# Patient Record
Sex: Female | Born: 1960 | Hispanic: No | Marital: Married | Smoking: Never smoker
Health system: Southern US, Community
[De-identification: ages and names within clinical notes are randomized; demographics above are authoritative.]

---

## 2017-09-21 ENCOUNTER — Emergency Department (HOSPITAL_BASED_OUTPATIENT_CLINIC_OR_DEPARTMENT_OTHER): Payer: Managed Care, Other (non HMO)

## 2017-09-21 ENCOUNTER — Emergency Department (HOSPITAL_BASED_OUTPATIENT_CLINIC_OR_DEPARTMENT_OTHER)
Admission: EM | Admit: 2017-09-21 | Discharge: 2017-09-21 | Disposition: A | Discharge status: Home / Self Care | Payer: Managed Care, Other (non HMO) | Attending: Emergency Medicine | Admitting: Emergency Medicine

## 2017-09-21 ENCOUNTER — Other Ambulatory Visit: Payer: Self-pay

## 2017-09-21 ENCOUNTER — Encounter (HOSPITAL_BASED_OUTPATIENT_CLINIC_OR_DEPARTMENT_OTHER): Payer: Self-pay | Admitting: *Deleted

## 2017-09-21 DIAGNOSIS — R109 Unspecified abdominal pain: Secondary | ICD-10-CM | POA: Insufficient documentation

## 2017-09-21 DIAGNOSIS — N281 Cyst of kidney, acquired: Secondary | ICD-10-CM | POA: Diagnosis not present

## 2017-09-21 LAB — COMPREHENSIVE METABOLIC PANEL
ALK PHOS: 67 U/L (ref 38–126)
ALT: 19 U/L (ref 0–44)
ANION GAP: 8 (ref 5–15)
AST: 21 U/L (ref 15–41)
Albumin: 4.2 g/dL (ref 3.5–5.0)
BILIRUBIN TOTAL: 0.7 mg/dL (ref 0.3–1.2)
BUN: 18 mg/dL (ref 6–20)
CALCIUM: 9.2 mg/dL (ref 8.9–10.3)
CO2: 30 mmol/L (ref 22–32)
Chloride: 102 mmol/L (ref 98–111)
Creatinine, Ser: 0.77 mg/dL (ref 0.44–1.00)
GFR calc Af Amer: >60 mL/min (ref >60)
GFR calc non Af Amer: >60 mL/min (ref >60)
Glucose, Bld: 116 mg/dL — ABNORMAL HIGH (ref 70–99)
Potassium: 4 mmol/L (ref 3.5–5.1)
SODIUM: 140 mmol/L (ref 135–145)
Total Protein: 6.8 g/dL (ref 6.5–8.1)

## 2017-09-21 LAB — URINALYSIS, ROUTINE W REFLEX MICROSCOPIC
Bilirubin Urine: NEGATIVE
Glucose, UA: NEGATIVE mg/dL
Ketones, ur: NEGATIVE mg/dL
Nitrite: NEGATIVE
Protein, ur: NEGATIVE mg/dL
pH: 7 (ref 5.0–8.0)

## 2017-09-21 LAB — URINALYSIS, MICROSCOPIC (REFLEX)

## 2017-09-21 LAB — CBC
HCT: 41.7 % (ref 36.0–46.0)
HEMOGLOBIN: 14.1 g/dL (ref 12.0–15.0)
MCH: 30.8 pg (ref 26.0–34.0)
MCHC: 33.8 g/dL (ref 30.0–36.0)
MCV: 91 fL (ref 78.0–100.0)
Platelets: 237 10*3/uL (ref 150–400)
RBC: 4.58 MIL/uL (ref 3.87–5.11)
RDW: 12.1 % (ref 11.5–15.5)
WBC: 4.5 10*3/uL (ref 4.0–10.5)

## 2017-09-21 LAB — PREGNANCY, URINE: PREG TEST UR: NEGATIVE

## 2017-09-21 LAB — LIPASE, BLOOD: Lipase: 45 U/L (ref 11–51)

## 2017-09-21 MED ORDER — MORPHINE SULFATE (PF) 4 MG/ML IV SOLN
4.0000 mg | Freq: Once | INTRAVENOUS | Status: AC
  Administered 2017-09-21: 4 mg via INTRAVENOUS
  Filled 2017-09-21: qty 1

## 2017-09-21 MED ORDER — NAPROXEN 500 MG PO TABS: 500.0000 mg | ORAL_TABLET | Freq: Two times a day (BID) | ORAL | 0 refills | Status: DC

## 2017-09-21 MED ORDER — ONDANSETRON HCL 4 MG PO TABS: 4.0000 mg | ORAL_TABLET | Freq: Three times a day (TID) | ORAL | 0 refills | Status: DC | PRN

## 2017-09-21 MED ORDER — CEPHALEXIN 500 MG PO CAPS: 500.0000 mg | ORAL_CAPSULE | Freq: Four times a day (QID) | ORAL | 0 refills | Status: DC

## 2017-09-21 MED ORDER — ONDANSETRON HCL 4 MG/2ML IJ SOLN
4.0000 mg | Freq: Once | INTRAMUSCULAR | Status: AC
  Administered 2017-09-21: 4 mg via INTRAVENOUS
  Filled 2017-09-21: qty 2

## 2017-09-21 MED ORDER — SODIUM CHLORIDE 0.9 % IV BOLUS
1000.0000 mL | Freq: Once | INTRAVENOUS | Status: AC
  Administered 2017-09-21: 1000 mL via INTRAVENOUS

## 2017-09-21 NOTE — ED Provider Notes (Signed)
[facility] EMERGENCY DEPARTMENT Provider Note   CSN: [id number] Arrival date & time: 09/21/17  5102     History   Chief Complaint Chief Complaint  Patient presents with  . Abdominal Pain    HPI Briana Reid is a 57 y.o. female with a history of kidney stones presents emergency department today for left flank pain and left abdominal pain.  Patient reports that yesterday around 5 PM she started developing a achy pain in her left thoracic/lower back.  She reports that this started radiating to her abdomen around 9:00.  She reports that her pain is been waxing and waning since that time, described as sharp and usually worsened after urination.  She does report associated hematuria.  She denies any fever, chills, nausea, vomiting, diarrhea, vaginal discharge, vaginal bleeding.  No right-sided or upper abdominal pain.  She reports normal bowel movement today.  No melena or hematochezia.  She has not been taking anything for symptoms.  She went to urgent care today and was told to come here for further evaluation.  She notes that nothing makes her symptoms better.  She rates her current pain level is a 6/10.  Patient is sexually active with one female partner including her husband.  She does not have concerns for STDs at this time.  HPI  History reviewed. No pertinent past medical history.  There are no active problems to display for this patient.   History reviewed. No pertinent surgical history.   OB History   None      Home Medications    Prior to Admission medications   Not on File    Family History No family history on file.  Social History Social History   Tobacco Use  . Smoking status: Never Smoker  . Smokeless tobacco: Never Used  Substance Use Topics  . Alcohol use: Not on file  . Drug use: Not on file     Allergies   Patient has no known allergies.   Review of Systems Review of Systems  All other systems reviewed and are  negative.    Physical Exam Updated Vital Signs BP (!) 144/98 (BP Location: Right Arm)   Pulse 66   Temp 97.9 F (36.6 C) (Oral)   Resp 18   Ht 5' (1.524 m)   Wt 52.6 kg   SpO2 99%   BMI 22.65 kg/m   Physical Exam  Constitutional: She appears well-developed and well-nourished.  HENT:  Head: Normocephalic and atraumatic.  Right Ear: External ear normal.  Left Ear: External ear normal.  Nose: Nose normal.  Mouth/Throat: Uvula is midline, oropharynx is clear and moist and mucous membranes are normal. No tonsillar exudate.  Eyes: Pupils are equal, round, and reactive to light. Right eye exhibits no discharge. Left eye exhibits no discharge. No scleral icterus.  Neck: Trachea normal. Neck supple. No spinous process tenderness present. No neck rigidity. Normal range of motion present.  Cardiovascular: Normal rate, regular rhythm and intact distal pulses.  No murmur heard. Pulses:      Radial pulses are 2+ on the right side, and 2+ on the left side.       Dorsalis pedis pulses are 2+ on the right side, and 2+ on the left side.       Posterior tibial pulses are 2+ on the right side, and 2+ on the left side.  Pulmonary/Chest: Effort normal and breath sounds normal. She exhibits no tenderness.  Abdominal: Soft. Bowel sounds are normal. She exhibits no  distension. There is tenderness. There is CVA tenderness (left). There is no rigidity, no rebound and no guarding.  Musculoskeletal: She exhibits no edema.  Lymphadenopathy:    She has no cervical adenopathy.  Neurological: She is alert.  Skin: Skin is warm and dry. No rash noted. She is not diaphoretic.  Psychiatric: She has a normal mood and affect.  Nursing note and vitals reviewed.    ED Treatments / Results  Labs (all labs ordered are listed, but only abnormal results are displayed) Labs Reviewed  COMPREHENSIVE METABOLIC PANEL - Abnormal; Notable for the following components:      Result Value   Glucose, Bld 116 (*)    All  other components within normal limits  URINALYSIS, ROUTINE W REFLEX MICROSCOPIC - Abnormal; Notable for the following components:   Color, Urine STRAW (*)    Specific Gravity, Urine <1.005 (*)    Hgb urine dipstick TRACE (*)    Leukocytes, UA SMALL (*)    All other components within normal limits  URINALYSIS, MICROSCOPIC (REFLEX) - Abnormal; Notable for the following components:   Bacteria, UA FEW (*)    All other components within normal limits  URINE CULTURE  LIPASE, BLOOD  CBC  PREGNANCY, URINE    EKG None  Radiology US Renal  Result Date: 09/21/2017 CLINICAL DATA:  Left flank pain.  Left renal mass. EXAM: RENAL / URINARY TRACT ULTRASOUND COMPLETE COMPARISON:  Body CT 09/21/2017 FINDINGS: Right Kidney: Length: 10.3 cm. Echogenicity within normal limits. No mass or hydronephrosis visualized. Left Kidney: Length: 11.7 cm. Echogenicity within normal limits. No hydronephrosis visualized. Two complex cystic lesions are seen in the mid polar region of the left kidney and lower pole of the left kidney measuring 3.3 x 2.3 x 2.6 cm and 2.4 x 2.4 x 2.3 cm. Bladder: Appears normal for degree of bladder distention. IMPRESSION: Two complex cystic lesions in the midpolar and lower pole region of the left kidney. These likely represent complicated cysts, containing thin septa. Normal appearance of the right kidney and urinary bladder. Electronically Signed   By: Fidela Salisbury M.D.   On: 09/21/2017 12:35   Ct Renal Stone Study  Result Date: 09/21/2017 CLINICAL DATA:  Left flank pain EXAM: CT ABDOMEN AND PELVIS WITHOUT CONTRAST TECHNIQUE: Multidetector CT imaging of the abdomen and pelvis was performed following the standard protocol without IV contrast. COMPARISON:  None. FINDINGS: Lower chest: Lung bases are clear. No effusions. Heart is normal size. Hepatobiliary: No focal hepatic abnormality. Gallbladder unremarkable. Pancreas: No focal abnormality or ductal dilatation. Spleen: No focal  abnormality.  Normal size. Adrenals/Urinary Tract: 2.4 cm low-density area in the midpole of the left kidney. 2 cm low-density area in the lower pole of the left kidney. These are difficult to characterize without intravenous contrast. No renal or ureteral stones bilaterally. Adrenal glands and urinary bladder unremarkable. Stomach/Bowel: Moderate stool burden in the colon. Stomach, large and small bowel grossly unremarkable. Vascular/Lymphatic: No evidence of aneurysm or adenopathy. Reproductive: Uterus and adnexa unremarkable.  No mass. Other: No free fluid or free air. Musculoskeletal: No acute bony abnormality. IMPRESSION: No renal or ureteral stones.  No hydronephrosis. Low-density lesions within the left kidney, likely cysts although these cannot be characterized fully on this noncontrast study. These could be better characterized with contrast-enhanced CT or ultrasound. Electronically Signed   By: Rolm Baptise M.D.   On: 09/21/2017 10:51    Procedures Procedures (including critical care time)  Medications Ordered in ED Medications  sodium chloride 0.9 %  bolus 1,000 mL ( Intravenous Stopped 09/21/17 1221)  ondansetron (ZOFRAN) injection 4 mg (4 mg Intravenous Given 09/21/17 1116)  morphine 4 MG/ML injection 4 mg (4 mg Intravenous Given 09/21/17 1117)     Initial Impression / Assessment and Plan / ED Course  I have reviewed the triage vital signs and the nursing notes.  Pertinent labs & imaging results that were available during my care of the patient were reviewed by me and considered in my medical decision making (see chart for details).     57 year old female with a history of kidney stones presenting with left flank pain radiation to the left abdomen with associated urinary symptoms.  Patient is with reassuring vital signs exam.  She is nonseptic appearing.  Patient does have left CVA tenderness.  Abdominal exam is benign without peritoneal signs or tenderness.  Given patient's urinary  symptoms and flank pain will obtain CT scan to evaluate for possible ureterolithiasis.  She denies any pelvic pain, vaginal discharge or vaginal bleeding. Do not feel she needs pelvic exam at this time.   Patient given fluids and pain medication in the department with improvement of symptoms.  She is without any nausea at this present time.  UA is with trace hemoglobin and possible UTI.  Will obtain urine culture.  No leukocytosis.  Kidney function within normal limits.  Labs otherwise reassuring.  CT scan with no identifiable ureteral stone.  There is possible cyst in the kidney. Will obtain recommended renal ultrasound.   Ultrasound to complex cystic structures likely representing complicated cyst.  Do not feel these are abscesses at this time as she is afebrile without a leukocytosis.  I discussed results with patient and her husband states understanding.  Will have him follow with urology.  They are to call today after leaving to ensure close follow-up. Will cover with Keflex for possible UTI.  Urine culture is pending.   Specific return precautions discussed. Time was given for all questions to be answered. The patient verbalized understanding and agreement with plan. The patient appears safe for discharge home.  Patient case discussed with Dr. Regenia Skeeter who is in agreement with plan.  Final Clinical Impressions(s) / ED Diagnoses   Final diagnoses:  Flank pain  Renal cyst    ED Discharge Orders         Ordered    cephALEXin (KEFLEX) 500 MG capsule  4 times daily     09/21/17 1352    naproxen (NAPROSYN) 500 MG tablet  2 times daily     09/21/17 1352    ondansetron (ZOFRAN) 4 MG tablet  Every 8 hours PRN     09/21/17 1352           Lorelle Gibbs 09/21/17 1432    Sherwood Gambler, MD 09/24/17 [phone]

## 2017-09-21 NOTE — ED Triage Notes (Signed)
Pt reports left flank pain radiating to her left lq, last kidney stone was in 1979, seen at urgent care and was sent her for further eval.

## 2017-09-21 NOTE — Discharge Instructions (Signed)
You were seen here today for flank pain and abdominal pain.  Your CT showed a cystic structure for which an ultrasound was ordered. Your ultrasound showed  Two complex cystic lesions in the midpolar and lower pole region of the left kidney. These likely represent complicated cysts, containing thin septa.  Your urine was questionable for a possible infection. I am covering you with antibtioics. A urine culture has been sent. Please take all of your antibiotics until finished!   You may develop abdominal discomfort or diarrhea from the antibiotic.  You may help offset this with probiotics which you can buy or get in yogurt. Do not eat or take the probiotics until 2 hours after your antibiotic. Do not take your medicine if develop an itchy rash, swelling in your mouth or lips, or difficulty breathing.   Please call urology today to schedule an appointment for follow up.   If you develop worsening or new concerning symptoms you can return to the emergency department for re-evaluation.

## 2017-09-22 LAB — URINE CULTURE: Culture: NO GROWTH

## 2017-11-05 ENCOUNTER — Other Ambulatory Visit: Payer: Self-pay | Admitting: Urology

## 2017-11-05 DIAGNOSIS — D49512 Neoplasm of unspecified behavior of left kidney: Secondary | ICD-10-CM

## 2017-12-17 ENCOUNTER — Ambulatory Visit (HOSPITAL_COMMUNITY)
Admission: RE | Admit: 2017-12-17 | Discharge: 2017-12-17 | Disposition: A | Discharge status: Home / Self Care | Payer: Managed Care, Other (non HMO) | Source: Ambulatory Visit | Attending: Urology | Admitting: Urology

## 2017-12-17 DIAGNOSIS — N281 Cyst of kidney, acquired: Secondary | ICD-10-CM | POA: Insufficient documentation

## 2017-12-17 DIAGNOSIS — D49512 Neoplasm of unspecified behavior of left kidney: Secondary | ICD-10-CM | POA: Insufficient documentation

## 2017-12-17 MED ORDER — GADOBUTROL 1 MMOL/ML IV SOLN
5.0000 mL | Freq: Once | INTRAVENOUS | Status: AC | PRN
  Administered 2017-12-17: 5 mL via INTRAVENOUS

## 2018-11-19 IMAGING — US US RENAL
1 series · 14 of 25 positions shown · non-contrast
Comparison: Body CT 09/21/2017

CLINICAL DATA: Left flank pain.  Left renal mass.

EXAM:
RENAL / URINARY TRACT ULTRASOUND COMPLETE

[Series 1: us renal · 0.21mm/px · 14 of 56 slices shown]
[im 1/56]
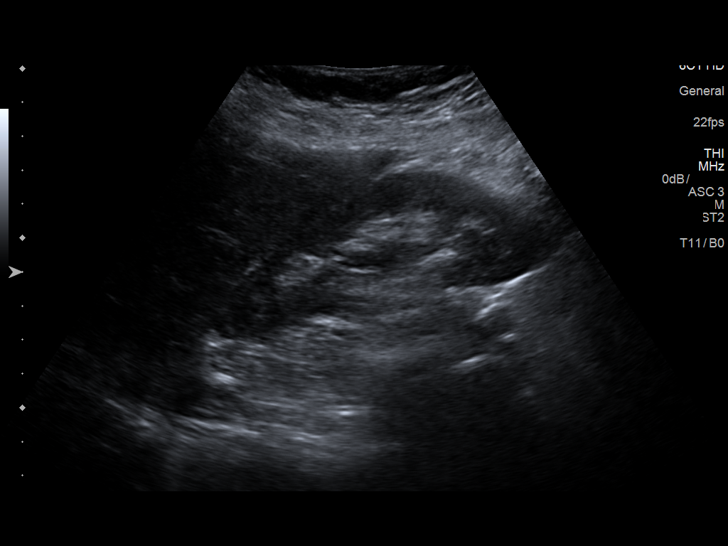
[im 5/56]
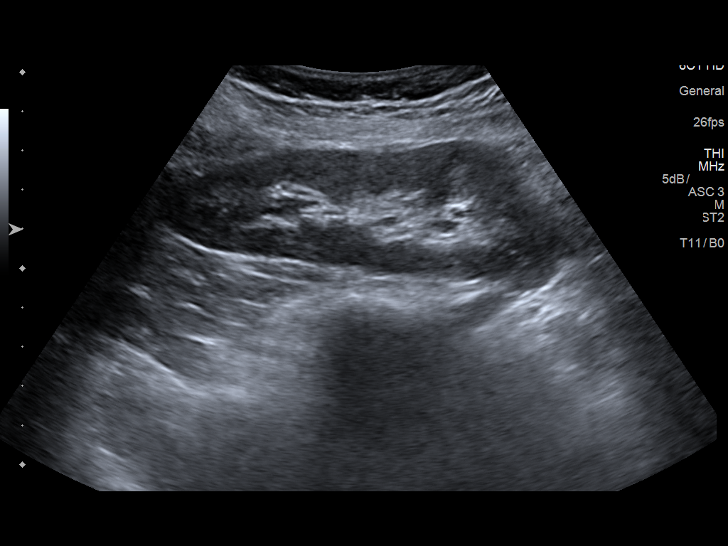
[im 10/56]
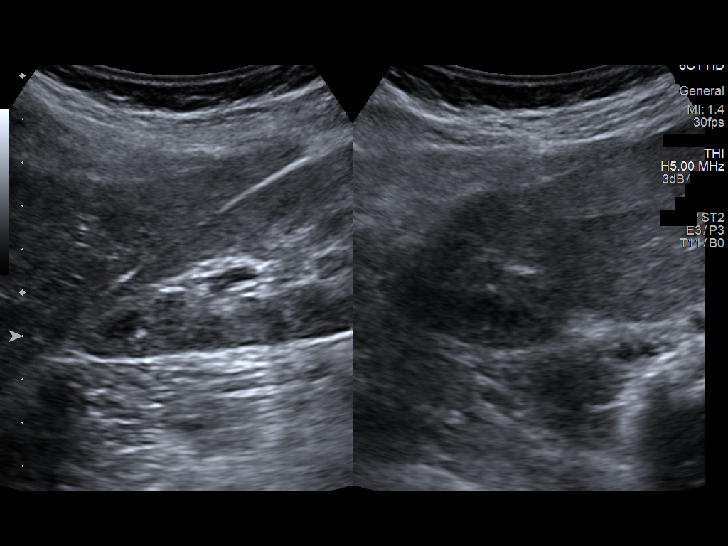
[im 14/56]
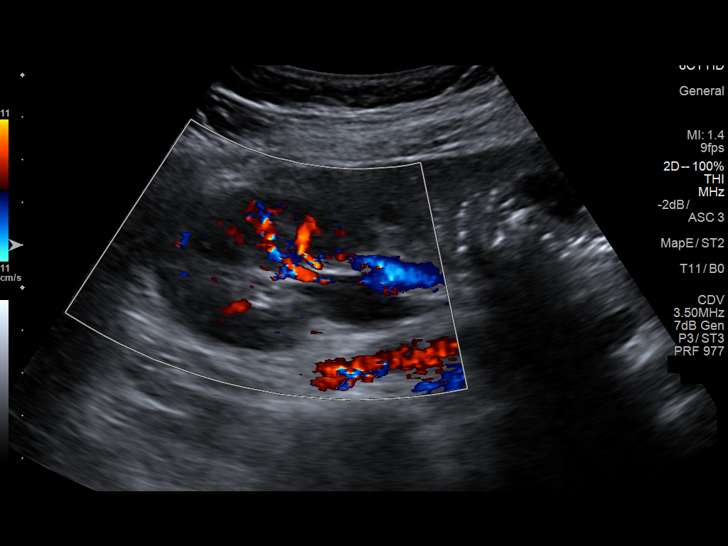
[im 19/56]
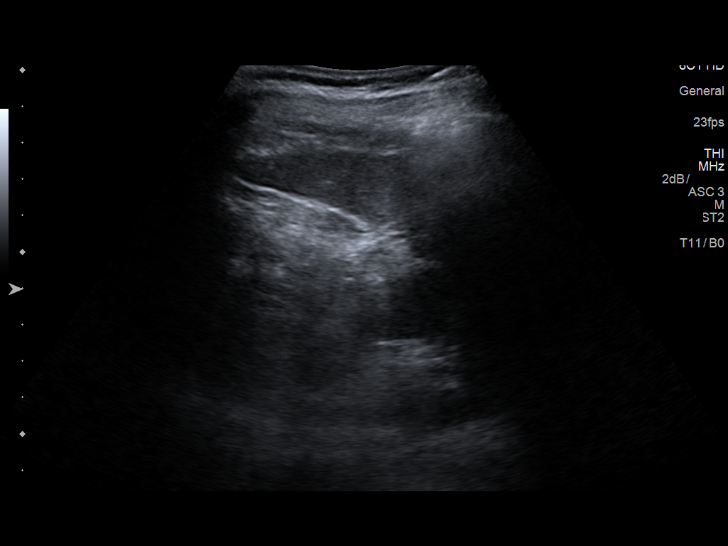
[im 21/56]
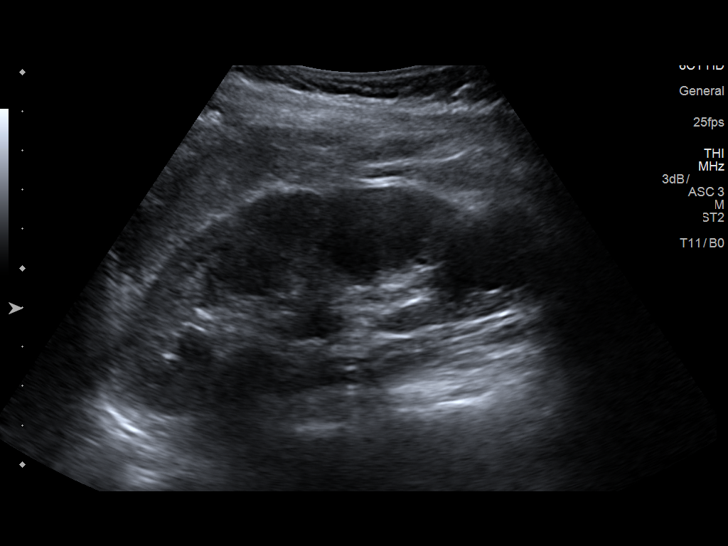
[im 26/56]
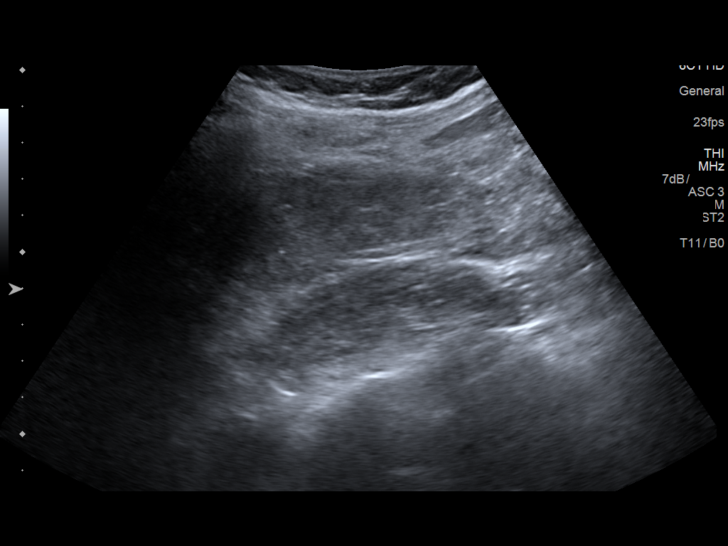
[im 30/56]
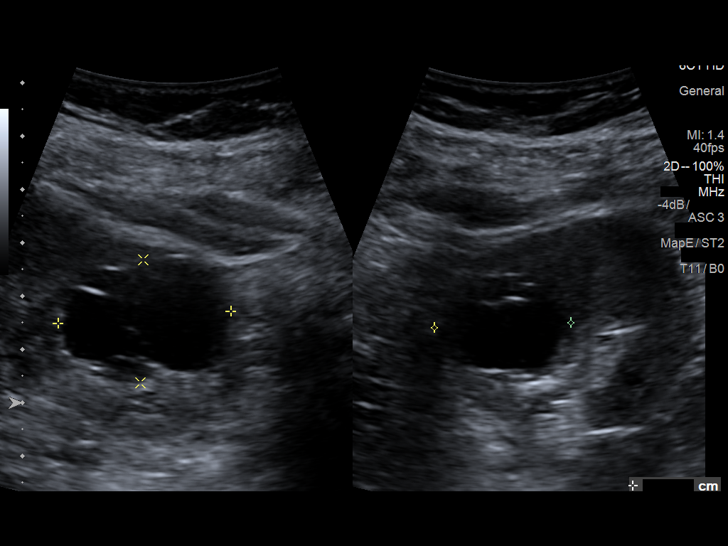
[im 35/56]
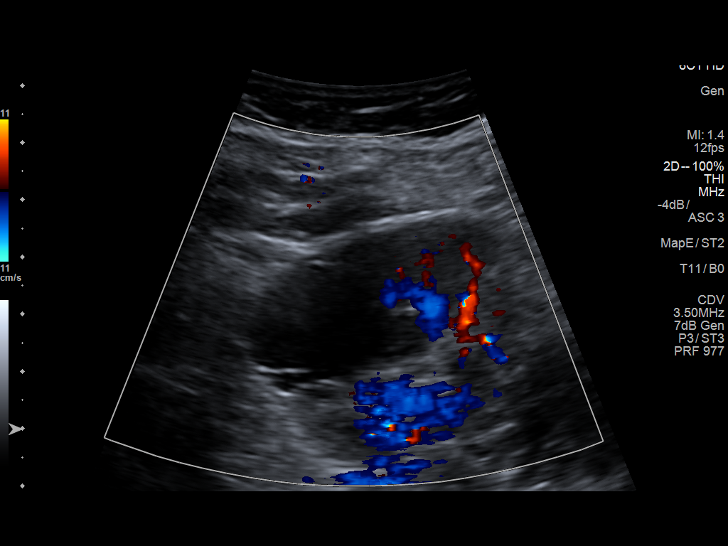
[im 37/56]
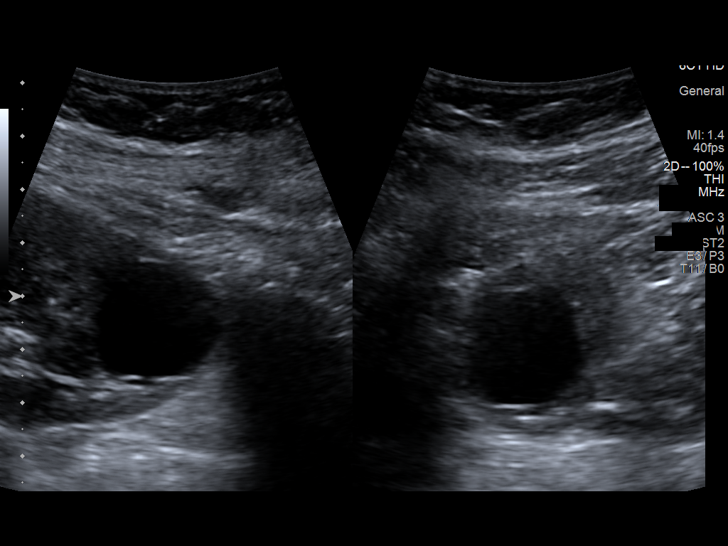
[im 42/56]
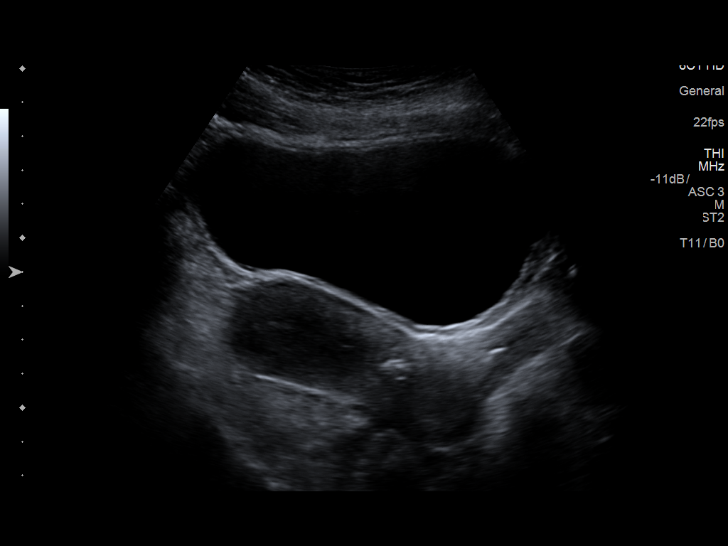
[im 46/56]
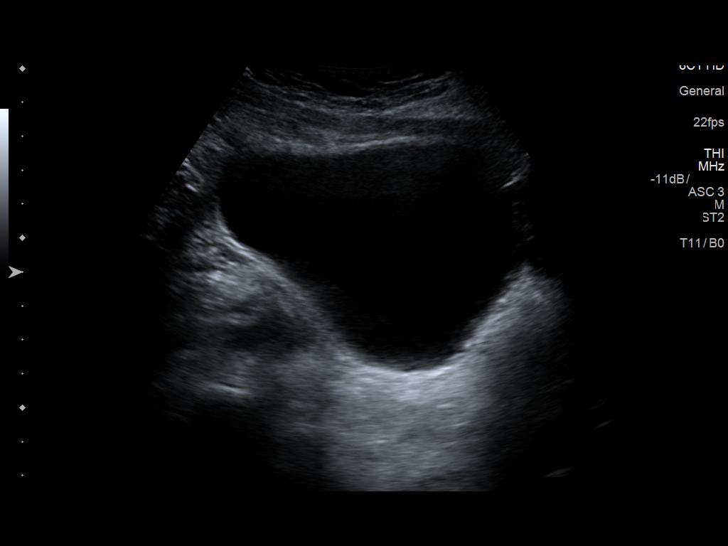
[im 51/56]
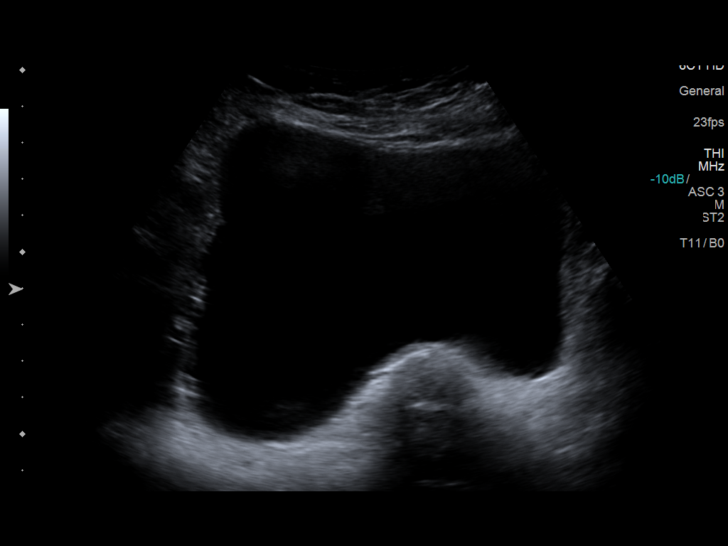
[im 56/56]
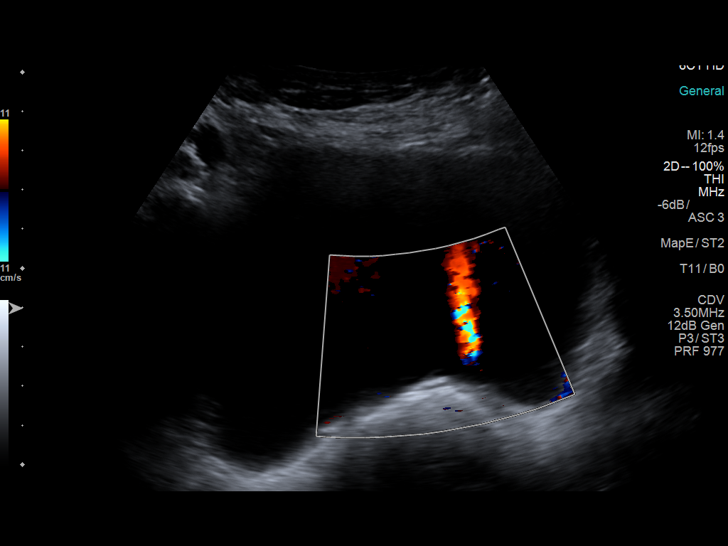

[14 of 25 positions shown; findings below may reference images not displayed]

FINDINGS: Right Kidney:

Length: 10.3 cm. Echogenicity within normal limits. No mass or
hydronephrosis visualized.

Left Kidney:

Length: 11.7 cm. Echogenicity within normal limits. No
hydronephrosis visualized. Two complex cystic lesions are seen in
the mid polar region of the left kidney and lower pole of the left
kidney measuring 3.3 x 2.3 x 2.6 cm and 2.4 x 2.4 x 2.3 cm.

Bladder:

Appears normal for degree of bladder distention.
IMPRESSION: Two complex cystic lesions in the midpolar and lower pole region of
the left kidney. These likely represent complicated cysts,
containing thin septa.

Normal appearance of the right kidney and urinary bladder.

## 2019-02-25 IMAGING — CT CT RENAL STONE PROTOCOL
2 of 4 series · 16 of 46 positions shown, 18 images · non-contrast
Comparison: None.

CLINICAL DATA: Left flank pain

EXAM:
CT ABDOMEN AND PELVIS WITHOUT CONTRAST
TECHNIQUE: Multidetector CT imaging of the abdomen and pelvis was performed
following the standard protocol without IV contrast.

[Series 2: axial st · axial · 0.79mm/px · z∈[-514,-144]mm · 13 of 82 slices shown, 15 images]
[im 4/82  soft-tissue]
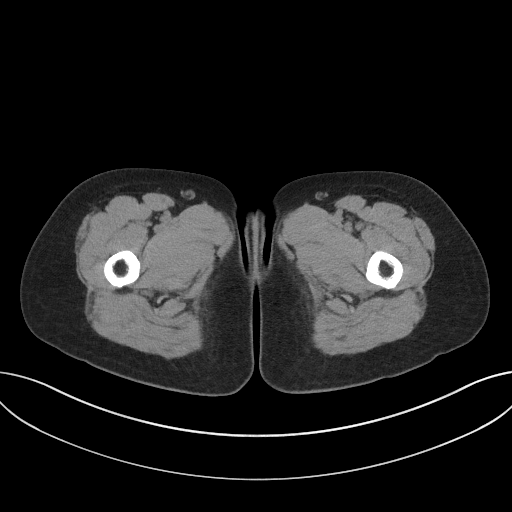
[im 4/82  bone]
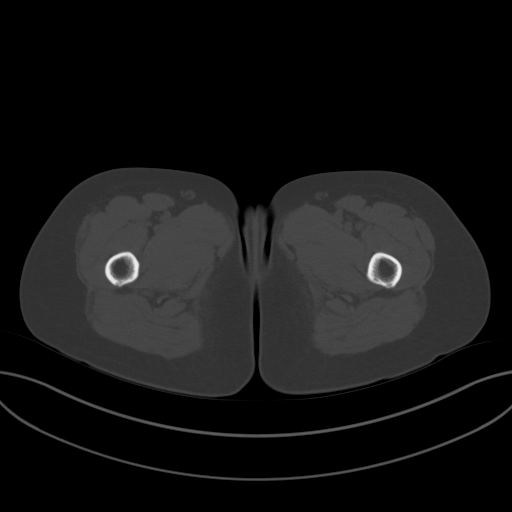
[im 11/82  soft-tissue]
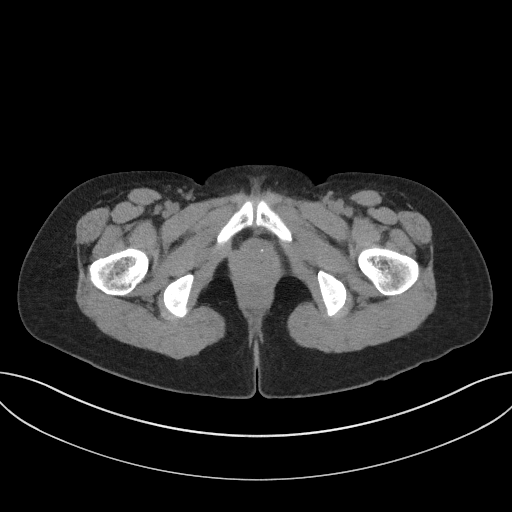
[im 17/82  soft-tissue]
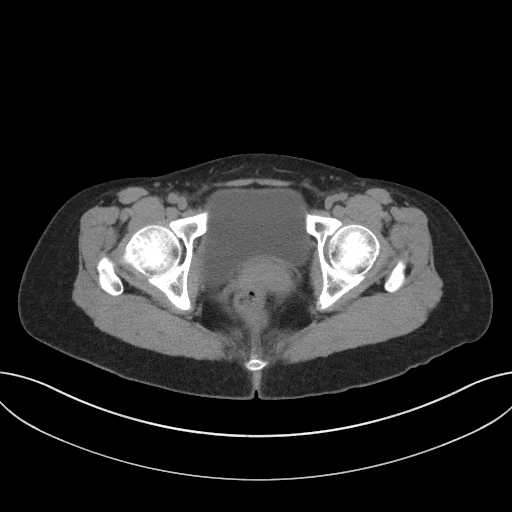
[im 24/82  soft-tissue]
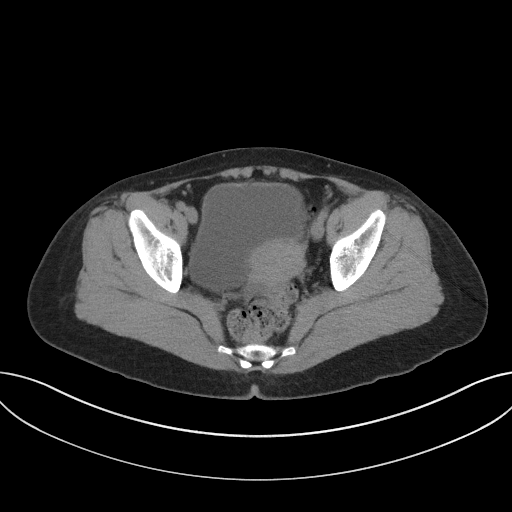
[im 28/82  soft-tissue]
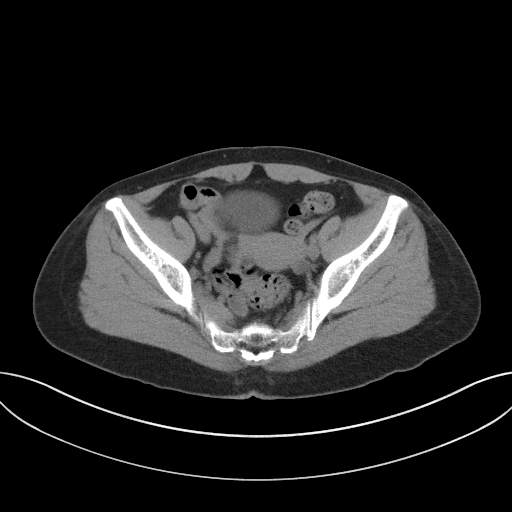
[im 34/82  soft-tissue]
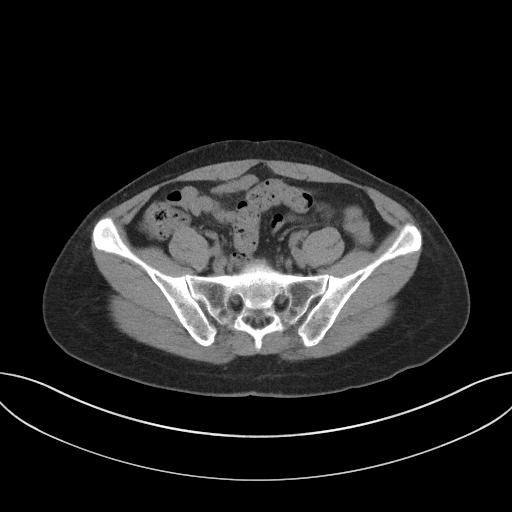
[im 41/82  soft-tissue]
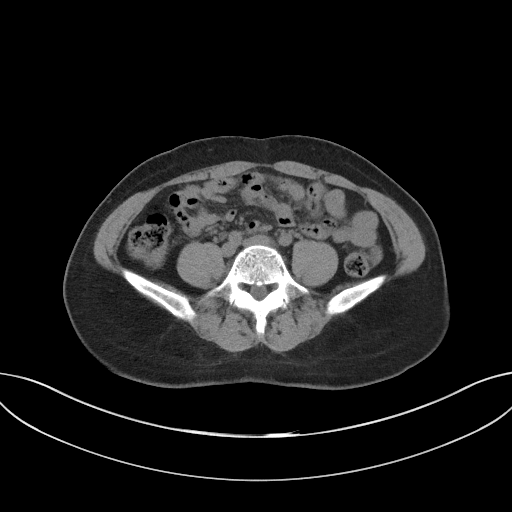
[im 48/82  soft-tissue]
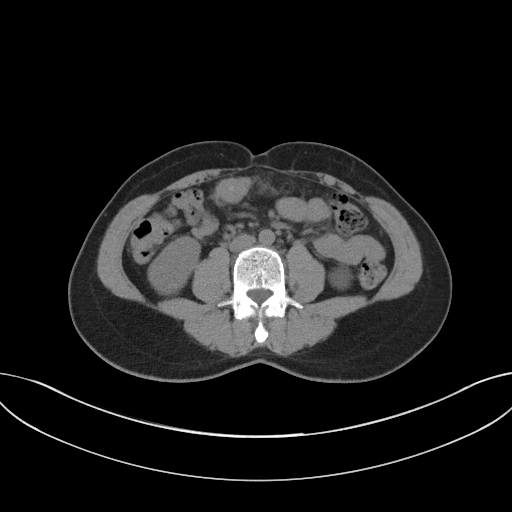
[im 55/82  soft-tissue]
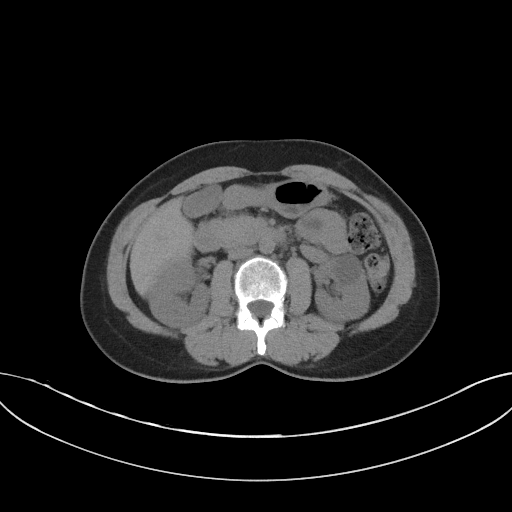
[im 55/82  bone]
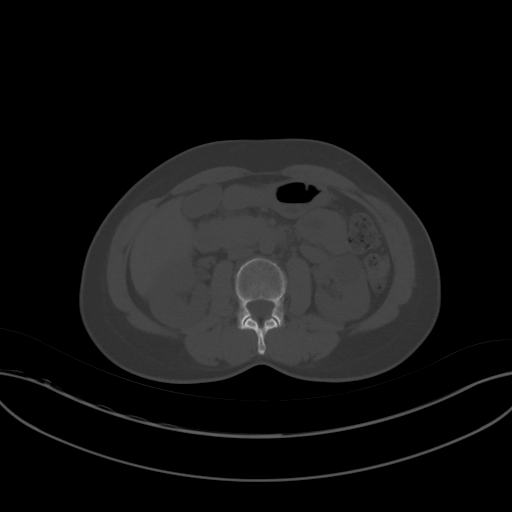
[im 58/82  soft-tissue]
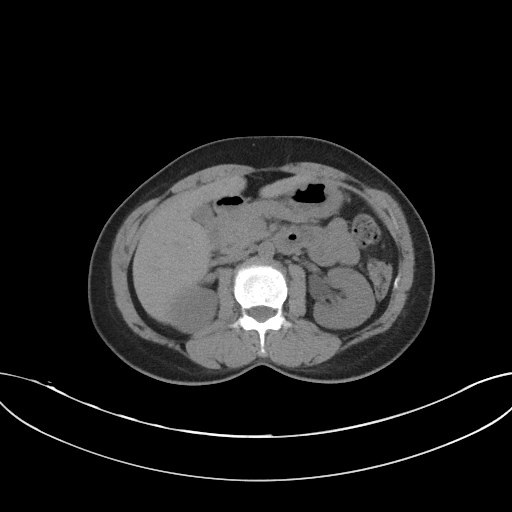
[im 65/82  soft-tissue]
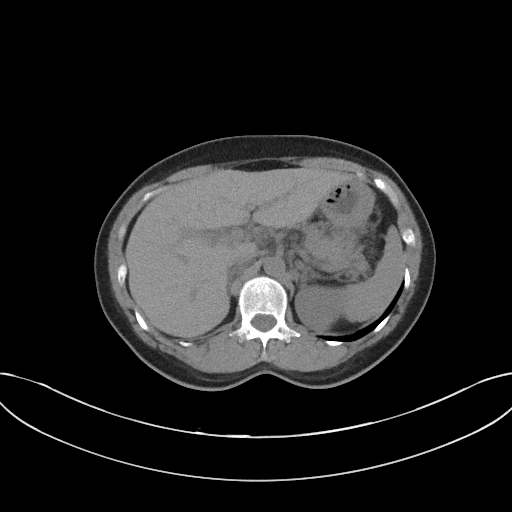
[im 71/82  soft-tissue]
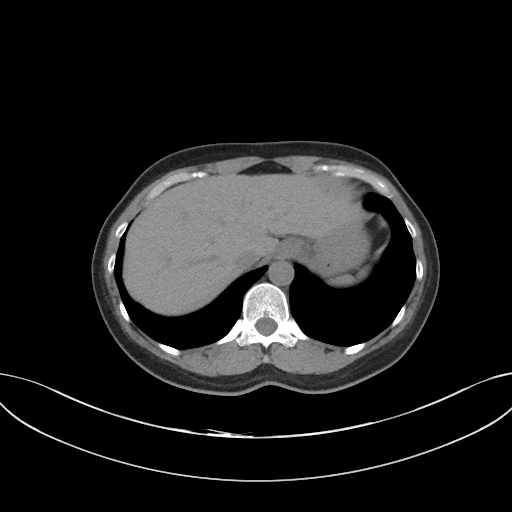
[im 78/82  soft-tissue]
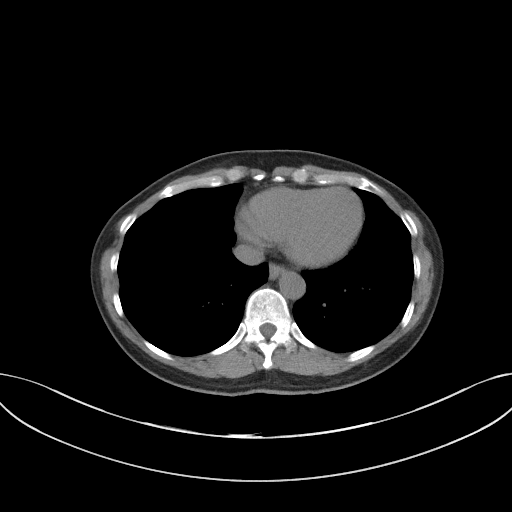

[Series 4: coronal st · coronal · 0.70mm/px · 3 of 71 slices shown]
[im 24/71  soft-tissue]
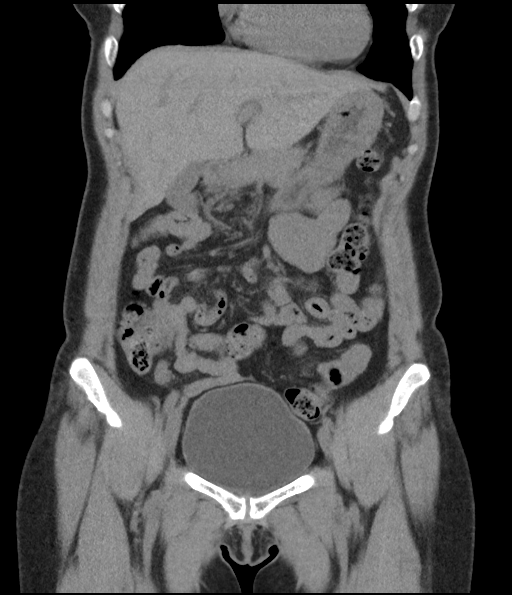
[im 32/71  soft-tissue]
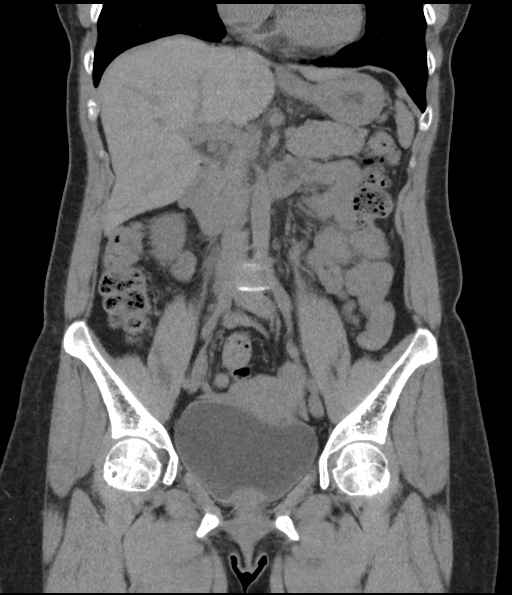
[im 39/71  soft-tissue]
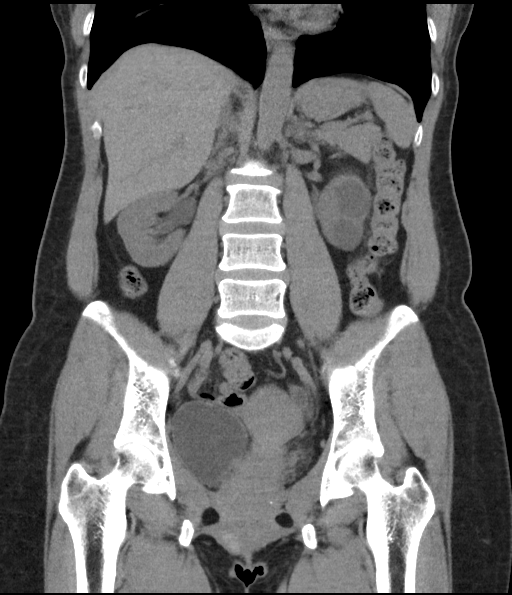

[16 of 46 positions shown; findings below may reference images not displayed]

FINDINGS: Lower chest: Lung bases are clear. No effusions. Heart is normal
size.

Hepatobiliary: No focal hepatic abnormality. Gallbladder
unremarkable.

Pancreas: No focal abnormality or ductal dilatation.

Spleen: No focal abnormality.  Normal size.

Adrenals/Urinary Tract: 2.4 cm low-density area in the midpole of
the left kidney. 2 cm low-density area in the lower pole of the left
kidney. These are difficult to characterize without intravenous
contrast. No renal or ureteral stones bilaterally. Adrenal glands
and urinary bladder unremarkable.

Stomach/Bowel: Moderate stool burden in the colon. Stomach, large
and small bowel grossly unremarkable.

Vascular/Lymphatic: No evidence of aneurysm or adenopathy.

Reproductive: Uterus and adnexa unremarkable.  No mass.

Other: No free fluid or free air.

Musculoskeletal: No acute bony abnormality.
IMPRESSION: No renal or ureteral stones.  No hydronephrosis.

Low-density lesions within the left kidney, likely cysts although
these cannot be characterized fully on this noncontrast study. These
could be better characterized with contrast-enhanced CT or
ultrasound.

## 2020-09-07 ENCOUNTER — Ambulatory Visit (INDEPENDENT_AMBULATORY_CARE_PROVIDER_SITE_OTHER): Admitting: Podiatry

## 2020-09-07 ENCOUNTER — Other Ambulatory Visit: Payer: Self-pay

## 2020-09-07 ENCOUNTER — Ambulatory Visit (INDEPENDENT_AMBULATORY_CARE_PROVIDER_SITE_OTHER)

## 2020-09-07 ENCOUNTER — Encounter: Payer: Self-pay | Admitting: Podiatry

## 2020-09-07 DIAGNOSIS — S99911A Unspecified injury of right ankle, initial encounter: Secondary | ICD-10-CM

## 2020-09-07 DIAGNOSIS — S92211A Displaced fracture of cuboid bone of right foot, initial encounter for closed fracture: Secondary | ICD-10-CM | POA: Diagnosis not present

## 2020-09-07 DIAGNOSIS — S9031XA Contusion of right foot, initial encounter: Secondary | ICD-10-CM | POA: Diagnosis not present

## 2020-09-07 DIAGNOSIS — S99921A Unspecified injury of right foot, initial encounter: Secondary | ICD-10-CM

## 2020-09-07 DIAGNOSIS — S93401A Sprain of unspecified ligament of right ankle, initial encounter: Secondary | ICD-10-CM | POA: Diagnosis not present

## 2020-09-07 DIAGNOSIS — N950 Postmenopausal bleeding: Secondary | ICD-10-CM | POA: Insufficient documentation

## 2020-09-07 MED ORDER — TRAMADOL HCL 50 MG PO TABS: 50.0000 mg | ORAL_TABLET | Freq: Two times a day (BID) | ORAL | 0 refills | Status: AC | PRN

## 2020-09-07 NOTE — Progress Notes (Signed)
  Subjective:   Patient ID: Briana Reid, female   DOB: 60 y.o.   MRN: [id number]   HPI 60 year old female presents the office today for concerns of an acute foot injury of the right foot which happened today when she was walking on the pavement that was uneven and she rolled her foot.  Since that she had immediate pain and swelling to the dorsal lateral aspect of the foot.  She has had no recent treatment she not able to put pressure on her foot.  No other injuries at the time.   Review of Systems  All other systems reviewed and are negative.  History reviewed. No pertinent past medical history.  History reviewed. No pertinent surgical history.   Current Outpatient Medications:    Multiple Vitamin (MULTIVITAMIN) tablet, Take 1 tablet by mouth daily., Disp: , Rfl:    ergocalciferol (VITAMIN D2) 1.25 MG (50000 UT) capsule, ergocalciferol (vitamin D2) 1,250 mcg (50,000 unit) capsule  TAKE 1 CAPSULE BY MOUTH ONCE A WEEK, Disp: , Rfl:   No Known Allergies        Objective:  Physical Exam  General: AAO x3, NAD  Dermatological: Skin is warm, dry and supple bilateral.  There are no open sores, no preulcerative lesions, no rash or signs of infection present.  Vascular: Dorsalis Pedis artery and Posterior Tibial artery pedal pulses are 2/4 bilateral with immedate capillary fill time.  There is no pain with calf compression, swelling, warmth, erythema.   Neruologic: Grossly intact via light touch bilateral.   Musculoskeletal: Majority tenderness is localized to the dorsal lateral aspect of foot mostly on the cuboid, fifth metatarsal area.  There is moderate edema there is no erythema.  Mild ecchymosis is present.  No area of tenderness to the medial ankle.  Slight discomfort of the distal fibula.  No other areas of pinpoint tenderness identified today.  Muscular strength 5/5 in all groups tested bilateral.  Gait: Unassisted, Nonantalgic.       Assessment:   Likely cuboid  fracture     Plan:  -Treatment options discussed including all alternatives, risks, and complications -Etiology of symptoms were discussed -X-rays were obtained and reviewed with the patient.  On the oblique view there is radiolucency noted along the cuboid concerning for fracture without displacement.  No evidence of acute fracture identified. -As point recommend immobilization in cam boot which was dispensed.  Continue crutches and nonweightbearing.  Ice and and elevation.  Prescribed tramadol as needed for pain.  I ordered MRI of the right foot.  Trula Slade DPM

## 2020-09-10 ENCOUNTER — Telehealth: Payer: Self-pay | Admitting: *Deleted

## 2020-09-10 ENCOUNTER — Telehealth: Payer: Self-pay | Admitting: Podiatry

## 2020-09-10 ENCOUNTER — Other Ambulatory Visit: Payer: Self-pay

## 2020-09-10 DIAGNOSIS — S92901G Unspecified fracture of right foot, subsequent encounter for fracture with delayed healing: Secondary | ICD-10-CM

## 2020-09-10 NOTE — Telephone Encounter (Signed)
The patient's husband called back and wanted to get this sent stat. They are going to be out of town next week and they have openings today to get this done for her so they would like it to be sent ASAP so they can be seen today. Fax number is [phone]

## 2020-09-10 NOTE — Telephone Encounter (Signed)
Patient's insurance is out of network with [facility]. Would like referral to be sent to [facility] on [address].

## 2020-09-10 NOTE — Telephone Encounter (Signed)
Faxed referral and called [facility]) 09/10/20, will schedule patient first available

## 2020-09-10 NOTE — Telephone Encounter (Signed)
Called [facility]),their first available is 09/20/20. They will contact patient to schedule.

## 2020-09-16 ENCOUNTER — Encounter: Payer: Self-pay | Admitting: Podiatry

## 2020-09-17 ENCOUNTER — Telehealth: Payer: Self-pay | Admitting: Podiatry

## 2020-09-17 NOTE — Telephone Encounter (Signed)
Patient would like a call back from Dr. Jacqualyn Posey. Patient had some questions

## 2020-09-19 ENCOUNTER — Other Ambulatory Visit: Payer: Self-pay

## 2020-09-21 ENCOUNTER — Ambulatory Visit: Admitting: Podiatry

## 2020-10-04 ENCOUNTER — Ambulatory Visit (INDEPENDENT_AMBULATORY_CARE_PROVIDER_SITE_OTHER)

## 2020-10-04 ENCOUNTER — Ambulatory Visit (INDEPENDENT_AMBULATORY_CARE_PROVIDER_SITE_OTHER): Admitting: Podiatry

## 2020-10-04 ENCOUNTER — Other Ambulatory Visit: Payer: Self-pay

## 2020-10-04 DIAGNOSIS — R609 Edema, unspecified: Secondary | ICD-10-CM | POA: Diagnosis not present

## 2020-10-04 DIAGNOSIS — S92901G Unspecified fracture of right foot, subsequent encounter for fracture with delayed healing: Secondary | ICD-10-CM

## 2020-10-04 DIAGNOSIS — S92211D Displaced fracture of cuboid bone of right foot, subsequent encounter for fracture with routine healing: Secondary | ICD-10-CM

## 2020-10-04 MED ORDER — GABAPENTIN 100 MG PO CAPS: 100.0000 mg | ORAL_CAPSULE | Freq: Every day | ORAL | 0 refills | Status: DC

## 2020-10-04 NOTE — Patient Instructions (Signed)
Gabapentin Capsules or Tablets What is this medication? GABAPENTIN (GA ba pen tin) treats nerve pain. It may also be used to prevent and control seizures in people with epilepsy. It works by calming overactivenerves in your body. This medicine may be used for other purposes; ask your health care provider orpharmacist if you have questions. COMMON BRAND NAME(S): Active-PAC with Gabapentin, Gabarone, Gralise, Neurontin What should I tell my care team before I take this medication? They need to know if you have any of these conditions: Alcohol or substance use disorder Kidney disease Lung or breathing disease Suicidal thoughts, plans, or attempt; a previous suicide attempt by you or a family member An unusual or allergic reaction to gabapentin, other medications, foods, dyes, or preservatives Pregnant or trying to get pregnant Breast-feeding How should I use this medication? Take this medication by mouth with a glass of water. Follow the directions on the prescription label. You can take it with or without food. If it upsets your stomach, take it with food. Take your medication at regular intervals. Do not take it more often than directed. Do not stop taking except on your care team'sadvice. If you are directed to break the 600 or 800 mg tablets in half as part of your dose, the extra half tablet should be used for the next dose. If you have notused the extra half tablet within 28 days, it should be thrown away. A special MedGuide will be given to you by the pharmacist with eachprescription and refill. Be sure to read this information carefully each time. Talk to your care team about the use of this medication in children. While this medication may be prescribed for children as young as 3 years for selectedconditions, precautions do apply. Overdosage: If you think you have taken too much of this medicine contact apoison control center or emergency room at once. NOTE: This medicine is only for you.  Do not share this medicine with others. What if I miss a dose? If you miss a dose, take it as soon as you can. If it is almost time for yournext dose, take only that dose. Do not take double or extra doses. What may interact with this medication? Alcohol Antihistamines for allergy, cough, and cold Certain medications for anxiety or sleep Certain medications for depression like amitriptyline, fluoxetine, sertraline Certain medications for seizures like phenobarbital, primidone Certain medications for stomach problems General anesthetics like halothane, isoflurane, methoxyflurane, propofol Local anesthetics like lidocaine, pramoxine, tetracaine Medications that relax muscles for surgery Narcotic medications for pain Phenothiazines like chlorpromazine, mesoridazine, prochlorperazine, thioridazine This list may not describe all possible interactions. Give your health care provider a list of all the medicines, herbs, non-prescription drugs, or dietary supplements you use. Also tell them if you smoke, drink alcohol, or use illegaldrugs. Some items may interact with your medicine. What should I watch for while using this medication? Visit your care team for regular checks on your progress. You may want to keep a record at home of how you feel your condition is responding to treatment. You may want to share this information with your care team at each visit. You should contact your care team if your seizures get worse or if you have any new types of seizures. Do not stop taking this medication or any of your seizure medications unless instructed by your care team. Stopping your medicationsuddenly can increase your seizures or their severity. This medication may cause serious skin reactions. They can happen weeks to months after starting the   medication. Contact your care team right away if you notice fevers or flu-like symptoms with a rash. The rash may be red or purple and then turn into blisters or  peeling of the skin. Or, you might notice a red rash with swelling of the face, lips or lymph nodes in your neck or under yourarms. Wear a medical identification bracelet or chain if you are taking thismedication for seizures. Carry a card that lists all your medications. You may get drowsy, dizzy, or have blurred vision. Do not drive, use machinery, or do anything that needs mental alertness until you know how this medication affects you. To reduce dizzy or fainting spells, do not sit or stand up quickly, especially if you are an older patient. Alcohol can increasedrowsiness and dizziness. Your mouth may get dry. Chewing sugarless gum or sucking hard candy, anddrinking plenty of water may help. Watch for new or worsening thoughts of suicide or depression. This includes sudden changes in mood, behaviors, or thoughts. These changes can happen at any time but are more common in the beginning of treatment or after a change in dose. Call your care team right away if you experience these thoughts orworsening depression. If you become pregnant while using this medication, you may enroll in the [place] Antiepileptic Drug Pregnancy Registry by calling 1-888-233-2334. This registry collects information about the safety of antiepileptic medication useduring pregnancy. What side effects may I notice from receiving this medication? Side effects that you should report to your care team as soon as possible: Allergic reactions or angioedema-skin rash, itching, hives, swelling of the face, eyes, lips, tongue, arms, or legs, trouble swallowing or breathing Rash, fever, and swollen lymph nodes Thoughts of suicide or self harm, worsening mood, feelings of depression Trouble breathing Unusual changes in mood or behavior in children after use such as difficulty concentrating, hostility, or restlessness Side effects that usually do not require medical attention (report to your careteam if they continue or are  bothersome): Dizziness Drowsiness Nausea Swelling of ankles, feet, or hands Vomiting This list may not describe all possible side effects. Call your doctor for medical advice about side effects. You may report side effects to FDA at1-800-FDA-1088. Where should I keep my medication? Keep out of reach of children and pets. Store at room temperature between 15 and 30 degrees C (59 and 86 degrees F).Get rid of any unused medication after the expiration date. This medication may cause accidental overdose and death if taken by other adults, children, or pets. Mix any unused medication with a substance like cat litter or coffee grounds. Then throw the medication away in a sealed containerlike a sealed bag or a coffee can with a lid. NOTE: This sheet is a summary. It may not cover all possible information. If you have questions about this medicine, talk to your doctor, pharmacist, orhealth care provider.  2022 Elsevier/Gold Standard (2019-11-12 12:16:18)  

## 2020-10-08 NOTE — Progress Notes (Signed)
Subjective: 60 year old female presents the office today for follow-up evaluation of cuboid fracture on the right foot.  States that she still having discomfort to the area.  She is having nerve symptoms including burning, tingling to her foot.  She still having some swelling as well.  She has been off of her foot.  No other injuries or changes since I last saw her.  Objective: AAO x3, NAD DP/PT pulses palpable bilaterally, CRT less than 3 seconds Majority tenderness is still localized to the lateral aspect of the left cuboid area.  Slight discomfort to the ankle but no specific area of pinpoint tenderness to the ankle noted.  There is edema present to the foot although mild.  There is no open lesions.  MMT decreased but she is guarding on the right side. No pain with calf compression, swelling, warmth, erythema  Assessment: Cuboid fracture  Plan: -All treatment options discussed with the patient including all alternatives, risks, complications.  -Repeat x-rays were obtained and reviewed.  Evidence of increased consolidation across the cuboid fracture noted.  No other evidence of acute fracture. -Given the swelling I recommended Unna boot which was applied today.  Precautions were advised when to remove this.  Continue cam boot.  As she feels better she can start to transition to weightbearing in the cam boot.  Given the nerve symptoms I am also going to go ahead and prescribe gabapentin we discussed side effects.  We discussed trying to move the toes and start try to get the foot moving as well but still in the boot. -Patient encouraged to call the office with any questions, concerns, change in symptoms.    Trula Slade DPM

## 2020-10-18 ENCOUNTER — Ambulatory Visit (INDEPENDENT_AMBULATORY_CARE_PROVIDER_SITE_OTHER)

## 2020-10-18 ENCOUNTER — Other Ambulatory Visit: Payer: Self-pay

## 2020-10-18 ENCOUNTER — Ambulatory Visit (INDEPENDENT_AMBULATORY_CARE_PROVIDER_SITE_OTHER): Admitting: Podiatry

## 2020-10-18 DIAGNOSIS — S92211D Displaced fracture of cuboid bone of right foot, subsequent encounter for fracture with routine healing: Secondary | ICD-10-CM | POA: Diagnosis not present

## 2020-10-18 DIAGNOSIS — M79671 Pain in right foot: Secondary | ICD-10-CM

## 2020-10-23 NOTE — Progress Notes (Signed)
Subjective: 60 year old female presents the office today for follow-up evaluation of cuboid fracture on the right foot.  She reports some swelling present but has improved.  Still in some discomfort again this has been improving.  She still has not put weight on her foot.  No recent injury or changes otherwise.  She has not been taking the gabapentin as she has been feeling better.  Objective: AAO x3, NAD DP/PT pulses palpable bilaterally, CRT less than 3 seconds There is still tenderness palpation along the early cuboid on the right side but appears to be improved.  There is no other specific area of pinpoint tenderness.  She does note some mild global diffuse tenderness to the foot and ankle but no specific area of tenderness.  There is decreased edema.  There is normal temperature gradient bilaterally and normal color to the foot.  MMT 4/5 due to guarding on the right side. No pain with calf compression, swelling, warmth, erythema  Assessment: Cuboid fracture  Plan: -All treatment options discussed with the patient including all alternatives, risks, complications.  -Repeat x-rays obtained and reviewed.  Radiolucencies under the cuboid mostly in the oblique view but appears to have increased consolidation. -At this point I discussed with her she can start to weight-bear as tolerated in the cam boot.  Discussed starting partial weightbearing for proceeding with full weightbearing.  Discussed range of motion exercises that she can do as well.  Continue to ice and elevate as well. -We will go and refer to physical therapy as well to start working range of motion. -She want to hold off on another Unna boot but she does relate this was helpful.  No follow-ups on file.    Trula Slade DPM

## 2020-11-01 ENCOUNTER — Other Ambulatory Visit: Payer: Self-pay

## 2020-11-01 ENCOUNTER — Ambulatory Visit (INDEPENDENT_AMBULATORY_CARE_PROVIDER_SITE_OTHER): Admitting: Podiatry

## 2020-11-01 ENCOUNTER — Ambulatory Visit (INDEPENDENT_AMBULATORY_CARE_PROVIDER_SITE_OTHER)

## 2020-11-01 DIAGNOSIS — M79671 Pain in right foot: Secondary | ICD-10-CM

## 2020-11-01 DIAGNOSIS — S92214D Nondisplaced fracture of cuboid bone of right foot, subsequent encounter for fracture with routine healing: Secondary | ICD-10-CM | POA: Diagnosis not present

## 2020-11-04 ENCOUNTER — Ambulatory Visit: Attending: Podiatry

## 2020-11-04 ENCOUNTER — Other Ambulatory Visit: Payer: Self-pay

## 2020-11-04 DIAGNOSIS — S92211D Displaced fracture of cuboid bone of right foot, subsequent encounter for fracture with routine healing: Secondary | ICD-10-CM | POA: Diagnosis not present

## 2020-11-04 DIAGNOSIS — M25571 Pain in right ankle and joints of right foot: Secondary | ICD-10-CM

## 2020-11-04 DIAGNOSIS — M25671 Stiffness of right ankle, not elsewhere classified: Secondary | ICD-10-CM

## 2020-11-04 DIAGNOSIS — M79671 Pain in right foot: Secondary | ICD-10-CM | POA: Diagnosis present

## 2020-11-04 DIAGNOSIS — M6281 Muscle weakness (generalized): Secondary | ICD-10-CM

## 2020-11-04 NOTE — Therapy (Addendum)
[place], [place], [address] Phone: [phone]   Fax:  [phone]  Physical Therapy Evaluation  Patient Details  Name: Briana Reid MRN: [id number] Date of Birth: 03/16/1960 Referring Provider (PT): Celesta Gentile   Encounter Date: 11/04/2020   PT End of Session - 11/04/20 1658     Visit Number 1    Number of Visits 13    Date for PT Re-Evaluation 12/18/20    PT Start Time 1608    PT Stop Time 1706    PT Time Calculation (min) 58 min    Activity Tolerance Patient tolerated treatment well;Patient limited by pain    Behavior During Therapy [facility] for tasks assessed/performed             History reviewed. No pertinent past medical history.  History reviewed. No pertinent surgical history.  There were no vitals filed for this visit.    Subjective Assessment - 11/04/20 1608     Subjective Pt came to the session with her husband in the lobby. Pt says she initially injured her foot while walking her dog, that is when she hurt her foot. Pt reports she has primarily been using crutches and the CAM boot over the past "several weeks" but is still able to complete ADLs around the house. Pt says her pain is 2-3/10 and is primarily on the bottom, side, and top of her foot. Pt says she also has pain in her toes especially when trying to do exercises where she "grabs a towel with her toes". Pt says she feels like she needs to move her foot after sitting for more than 5 minutes due to "pressure" that starts to build in her foot before she starts to experience pain. Pt says when she starts to feel this "pressure" she has to move and either stands up, walks, or repositions to elevate her foot. Pt reports she has been doing a few exercises that she found online such as wiggling her toes and grabbing a towel with her toes. Pt says she is able to move around the house and complete her ADLs independently but has her  husband help her if she needs it. Pt lives in a home with steps to enter in the front and steps to enter through the garage. Pt has 2 older children and a dog.    Patient is accompained by: Family member    Pertinent History Osteoporosis    Limitations Walking;Standing    How long can you sit comfortably? < 73minutes    How long can you stand comfortably? 10 minutes    How long can you walk comfortably? 10 minutes    Diagnostic tests X ray    Patient Stated Goals "I want to walk and get better"    Currently in Pain? No/denies    Pain Score 3     Pain Location Foot    Pain Orientation Right    Pain Descriptors / Indicators Aching;Pressure    Pain Type Acute pain    Pain Onset 1 to 4 weeks ago    Pain Frequency Intermittent    Aggravating Factors  standing, sitting    Pain Relieving Factors lying down, moving toes, elevation    Effect of Pain on Daily Activities unable to do ADLS    Multiple Pain Sites No            [facility] Adult PT Treatment/Exercise:  Therapeutic Exercise: - ankle 4 way (DF, PF, IV, ER) in  long sit, 1x10 each; no resistance   Manual Therapy: -   Neuromuscular re-ed: -   Therapeutic Activity: -   Self-care/Home Management: - Explained diagnosis, impairments, examination findings, and therapy prognosis.      [facility] PT Assessment - 11/04/20 0001       Assessment   Medical Diagnosis R cuboid fracture    Referring Provider (PT) Celesta Gentile    Onset Date/Surgical Date 09/07/20    Hand Dominance Right    Next MD Visit 11/19/20    Prior Therapy Yes      Precautions   Precaution Comments She can come out of the boot  as able- Per Dr. Jacqualyn Posey      Balance Screen   Has the patient fallen in the past 6 months No    Has the patient had a decrease in activity level because of a fear of falling?  Yes    Is the patient reluctant to leave their home because of a fear of falling?  No      Home Environment   Additional Comments House, steps in front and  garage.      Prior Function   Level of Independence Independent with basic ADLs    Vocation Retired    Leisure workout at gym, cycling, walking dogs      AROM   Right Ankle Dorsiflexion 0   painful, seated EOB   Right Ankle Plantar Flexion 20   painful, seated EOB   Right Ankle Inversion --   painful, NT   Right Ankle Eversion --   painful NT   Left Ankle Dorsiflexion 20   seated EOB   Left Ankle Plantar Flexion 40   seated EOB   Left Ankle Inversion --   WFL   Left Ankle Eversion --   [facility]     Strength   Right Knee Flexion 4-/5    Right Knee Extension 4-/5    Left Knee Flexion 5/5    Left Knee Extension 5/5    Right Ankle Dorsiflexion 3-/5    Right Ankle Plantar Flexion 3-/5    Right Ankle Inversion 3-/5    Right Ankle Eversion 3-/5    Left Ankle Dorsiflexion 5/5    Left Ankle Plantar Flexion 5/5    Left Ankle Inversion 5/5    Left Ankle Eversion 5/5      Palpation   Palpation comment Rt ankle and foot appeared darker and discolored compared to Lt; TTP along lateral border and dorsal aspect of foot and plantar aspect of metatarsals 3-5; edema present around cuboid      Special Tests   Other special tests Rt 47.5cm figure 8      Ambulation/Gait   Assistive device Crutches   CAM boot   Gait Pattern Step-to pattern    Ambulation Surface Level                        Objective measurements completed on examination: See above findings.                PT Education - 11/04/20 1802     Education Details Pt given HEP and exercises were explained and demonstrated. Pt was given handout with instructions and pictures.    Person(s) Educated Patient    Methods Explanation;Demonstration;Handout    Comprehension Verbalized understanding;Returned demonstration              PT Short Term Goals - 11/04/20 1739  PT SHORT TERM GOAL #1   Title Pt will be able to tolerate standing WB through Rt ankle and foot without CAM boot with minimal reports  of pain to improve tolerance to ADLs.    Baseline unable    Target Date 11/25/20      PT SHORT TERM GOAL #2   Title Pt will be able to achieve 5 degrees of AROM dorsiflexion while seated to improve weight bearing function of the ankle in standing and walking.    Baseline see flowsheet    Target Date 11/25/20      PT SHORT TERM GOAL #3   Title Pt will be able to perform a sit to stand transfer modified indep without the CAM boot in order to optimize independence with ADLs.    Baseline unable    Target Date 12/16/20               PT Long Term Goals - 11/04/20 1744       PT LONG TERM GOAL #1   Title Pt will be able to walk 50 feet without CAM boot with minimal reports of pain and no increase in swelling to optimize walking capacity.    Baseline unable    Target Date 12/16/20      PT LONG TERM GOAL #2   Title Pt will be able to achieve 5/5 strength in Rt quadriceps and hamstrings to optimize function with walking, transfers, and ADLs.    Baseline 4-    Target Date 12/16/20      PT LONG TERM GOAL #3   Title Pt will be able to achieve 5/5 strength in Rt dorsiflexors without pain to improve stability of the ankle joint and optimize standing function.    Baseline 3-    Target Date 12/16/20      PT LONG TERM GOAL #4   Title Pt will be able to achieve 71% predicted score on FOTO to signify meaningful improvement in functional abilites.    Baseline 58%    Target Date 12/16/20                    Plan - 11/04/20 1710     Clinical Impression Statement Pt is a 60 y/o female that presents to [place] with chief complaint of Rt foot pain following Rt cuboid fracture s/p 09/07/20. Pt reports 2-3/10 pain in foot with walking, standing, and sitting for greater than 5-10 minutes. Pt presents with mobility deficits of Rt ankle into dorsiflexion, plantarflexion, inversion, and eversion that is primarily limited due to pain and muscle inhibition. Additional impairments include strength  deficits of the Rt quadricep and hamstrings and visual and palpable edema around the anterior and lateral border of the foot and ankle. Pt reports pain with all palpation around the anterior and lateral portion of the foot and with palpation around the 3-5th metatarsals. Pt reports pain with AROM into dorsiflexion and plantarflexion with a yellow resistance band that decreases without resistance. Pt reports minimal pain with inversion and eversion AROM in long sitting. Pt Rt foot appeared darker in appearance compared to Lt by the end of the session, but pain subsided. Pt will benefit from skilled physical therapy to address deficits above and optimize quality of life and function.    Personal Factors and Comorbidities Age;Comorbidity 1    Comorbidities Osteoporosis    Examination-Activity Limitations Stand;Transfers;Stairs    Examination-Participation Restrictions Community Activity;Cleaning    Stability/Clinical Decision Making Stable/Uncomplicated    Clinical Decision Making  Low    Rehab Potential Excellent    PT Frequency 2x / week    PT Duration 6 weeks    PT Treatment/Interventions ADLs/Self Care Home Management;Aquatic Therapy;Cryotherapy;Electrical Stimulation;Moist Heat;Gait training;Functional mobility training;Therapeutic activities;Therapeutic exercise;Balance training;Neuromuscular re-education;Patient/family education;Passive range of motion;Dry needling;Manual techniques;Taping;Vasopneumatic Device    PT Next Visit Plan Assess dorsiflexion and plantarflexion PROM and AROM measurements with knee extended. Manual for pain and edema management, assess response to partial WB, work on ankle AROM and knee strength into flexion and extension.    PT Home Exercise Plan [id number]    Consulted and Agree with Plan of Care Patient             Patient will benefit from skilled therapeutic intervention in order to improve the following deficits and impairments:  Abnormal gait, Decreased range of  motion, Difficulty walking, Pain, Decreased activity tolerance, Decreased strength, Increased edema  Visit Diagnosis: Pain in right ankle and joints of right foot  Muscle weakness (generalized)  Stiffness of right ankle, not elsewhere classified  Right foot pain     Problem List Patient Active Problem List   Diagnosis Date Noted   Postmenopausal bleeding 09/07/2020   Glade Lloyd, SPT 11/04/20 7:35 PM   [facility] [address] [place], [place], [address] Phone: [phone]   Fax:  [phone]  Name: Briana Reid MRN: [id number] Date of Birth: Feb 02, 1960

## 2020-11-08 NOTE — Progress Notes (Signed)
Subjective: 60 year old female presents the office today for follow-up evaluation of cuboid fracture on the right foot.  Since the last time she has been putting some weight on her foot but when she gets discomfort she stops putting weight on it and she is still in the boot.  She has been working some engine motion to her toes which has gotten somewhat better.  No recent injury or changes otherwise since I last saw her.  She states her pain level is 2-3/10.  Objective: AAO x3, NAD DP/PT pulses palpable bilaterally, CRT less than 3 seconds There is still mild tenderness palpation along the early cuboid.  Mild diffuse tenderness to the foot there is no other specific ear tenderness.  There is still some mild edema present in the foot.  There is a decreased temperature on the right foot compared to the left.  MMT 5/5.   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  -Repeat x-rays obtained reviewed.  Increasing slightly suppressed cuboid fracture.  No evidence of acute fracture. -At this point I like for her to start physical therapy and she is scheduled for later in the week.  Also discussed gabapentin.  Discussed range of motion exercises she can weight-bear as tolerated in the cam boot as she progresses with physical therapy she can transition back into regular shoe.  Trula Slade DPM

## 2020-11-09 ENCOUNTER — Ambulatory Visit: Attending: Podiatry

## 2020-11-09 ENCOUNTER — Other Ambulatory Visit: Payer: Self-pay

## 2020-11-09 DIAGNOSIS — M25571 Pain in right ankle and joints of right foot: Secondary | ICD-10-CM | POA: Insufficient documentation

## 2020-11-09 DIAGNOSIS — M79671 Pain in right foot: Secondary | ICD-10-CM | POA: Diagnosis present

## 2020-11-09 DIAGNOSIS — M25671 Stiffness of right ankle, not elsewhere classified: Secondary | ICD-10-CM | POA: Diagnosis present

## 2020-11-09 DIAGNOSIS — M6281 Muscle weakness (generalized): Secondary | ICD-10-CM | POA: Diagnosis present

## 2020-11-09 NOTE — Therapy (Addendum)
[place], [place], [address] Phone: [phone]   Fax:  [phone]  Physical Therapy Treatment  Patient Details  Name: Briana Reid MRN: [id number] Date of Birth: 08-28-1960 Referring Provider (PT): Celesta Gentile   Encounter Date: 11/09/2020   PT End of Session - 11/09/20 1708     Visit Number 2    Number of Visits 13    Date for PT Re-Evaluation 12/18/20    PT Start Time 0174    PT Stop Time 9449    PT Time Calculation (min) 45 min    Equipment Utilized During Treatment Other (comment)   one crutch and CAM boot   Activity Tolerance Patient tolerated treatment well;No increased pain    Behavior During Therapy [facility] for tasks assessed/performed             History reviewed. No pertinent past medical history.  History reviewed. No pertinent surgical history.  There were no vitals filed for this visit.   Subjective Assessment - 11/09/20 1705     Subjective Pt presented to session today with one crutch and reported that at her Dr. appointment he said to start using crutches less. Pt said she has been doing more exercises at home, does her HEP, and has noticed her foot and ankle can "move more". Pt says she has noticed a decrease in swelling as well. Pt reports her foot only hurts when WB and doing exercises, but that she knows she "needs to move it to make it better".    Currently in Pain? No/denies            [facility] Adult PT Treatment/Exercise:  Therapeutic Exercise: - Seated heel raises 2x12 - Sit to stand to plinth in CAM boot 3x10 - Standing marches in CAM boot 3x20   Manual Therapy: - PROM supine of metatarsals into flexion and extension - PROM supine of ankle and foot into dorsiflexion, plantarflexion, IV, and EV.    Gait training:  - Pre gait training: Lateral wt shift in CAM boot 2x15 - Pre gait training: Anterior/posterior wt shift in CAM boot 2x15 - Walking in CAM boot  w/o AD at parallel bars and into clinic 20 ft  Neuromuscular re-ed: - NA  Therapeutic Activity: - NA  Self-care/Home Management: -  NA    [facility] PT Assessment - 11/10/20 0001       AROM   Right Ankle Dorsiflexion 3   supine   Right Ankle Plantar Flexion 40   supine   Left Ankle Dorsiflexion 10   supine   Left Ankle Plantar Flexion 45   supine     Palpation   Palpation comment Rt foot skin WNL; TTP along lateral border and dorsal aspect of foot and plantar aspect of metatarsals 3-5; decreased palpable edema around cuboid                                       PT Short Term Goals - 11/04/20 1739       PT SHORT TERM GOAL #1   Title Pt will be able to tolerate standing WB through Rt ankle and foot without CAM boot with minimal reports of pain to improve tolerance to ADLs.    Baseline unable    Target Date 11/25/20      PT SHORT TERM GOAL #2   Title Pt will be able to achieve 5  degrees of AROM dorsiflexion while seated to improve weight bearing function of the ankle in standing and walking.    Baseline see flowsheet    Target Date 11/25/20      PT SHORT TERM GOAL #3   Title Pt will be able to perform a sit to stand transfer modified indep without the CAM boot in order to optimize independence with ADLs.    Baseline unable    Target Date 12/16/20               PT Long Term Goals - 11/04/20 1744       PT LONG TERM GOAL #1   Title Pt will be able to walk 50 feet without CAM boot with minimal reports of pain and no increase in swelling to optimize walking capacity.    Baseline unable    Target Date 12/16/20      PT LONG TERM GOAL #2   Title Pt will be able to achieve 5/5 strength in Rt quadriceps and hamstrings to optimize function with walking, transfers, and ADLs.    Baseline 4-    Target Date 12/16/20      PT LONG TERM GOAL #3   Title Pt will be able to achieve 5/5 strength in Rt dorsiflexors without pain to improve stability of the  ankle joint and optimize standing function.    Baseline 3-    Target Date 12/16/20      PT LONG TERM GOAL #4   Title Pt will be able to achieve 71% predicted score on FOTO to signify meaningful improvement in functional abilites.    Baseline 58%    Target Date 12/16/20                   Plan - 11/09/20 1710     Clinical Impression Statement Pt able to demonstrate increased AROM compared to previous session with reports of decreased pain as well. Moderate muscle guarding in metatarsals and foot for PROM into flexion, extension, dorsiflexion, plantarflexion, and inversion/eversion with palpable ceveatations throughout with intermittent reports of sharp pain that immediately subsided. Able to progress patient to partial WB in the CAM boot with minimal reports of "pressure and achy" pain that subside with cessation of WB activity. Pt required moderate verbal cues throughout WB exercises for an upright trunk and Rt knee extension. Pt demonstrated good carry over from anterior to posterior weight shifts into proper walking mechanics demonstrated by improved stance time on Rt with increased hip and knee extension. Pt had significant antalgic gait after 37ft of walking in CAM boot without her single axillary crutch. Pt instructed to continue ambulating with single axillary crutch at home. Pt denied the use of modalties at the end of session.    Personal Factors and Comorbidities Age;Comorbidity 1    Comorbidities Osteoporosis    Examination-Activity Limitations Stand;Transfers;Stairs    Examination-Participation Restrictions Community Activity;Cleaning    Rehab Potential Excellent    PT Frequency 2x / week    PT Duration 6 weeks    PT Treatment/Interventions ADLs/Self Care Home Management;Aquatic Therapy;Cryotherapy;Electrical Stimulation;Moist Heat;Gait training;Functional mobility training;Therapeutic activities;Therapeutic exercise;Balance training;Neuromuscular re-education;Patient/family  education;Passive range of motion;Dry needling;Manual techniques;Taping;Vasopneumatic Device    PT Next Visit Plan Assess response to partial WB, work on ankle AROM and knee strength into flexion and extension. Continue PROM for improved mobility of joints throughout foot and ankle. Consider manual therapy for decreasing muscle guarding during PROM.    PT Home Exercise Plan [id number]    Consulted and  Agree with Plan of Care Patient             Patient will benefit from skilled therapeutic intervention in order to improve the following deficits and impairments:  Abnormal gait, Decreased range of motion, Difficulty walking, Pain, Decreased activity tolerance, Decreased strength, Increased edema  Visit Diagnosis: No diagnosis found.     Problem List Patient Active Problem List   Diagnosis Date Noted   Postmenopausal bleeding 09/07/2020   Glade Lloyd, SPT 11/10/20 12:11 PM  [facility] [address] [place], [place], [address] Phone: [phone]   Fax:  [phone]  Name: Emberlynn Riggan MRN: [id number] Date of Birth: Mar 10, 1960

## 2020-11-18 ENCOUNTER — Other Ambulatory Visit: Payer: Self-pay

## 2020-11-18 ENCOUNTER — Ambulatory Visit

## 2020-11-18 DIAGNOSIS — M25571 Pain in right ankle and joints of right foot: Secondary | ICD-10-CM | POA: Diagnosis not present

## 2020-11-18 DIAGNOSIS — M25671 Stiffness of right ankle, not elsewhere classified: Secondary | ICD-10-CM

## 2020-11-18 DIAGNOSIS — M6281 Muscle weakness (generalized): Secondary | ICD-10-CM

## 2020-11-18 DIAGNOSIS — M79671 Pain in right foot: Secondary | ICD-10-CM

## 2020-11-18 NOTE — Therapy (Signed)
[place], [place], [address] Phone: [phone]   Fax:  [phone]  Physical Therapy Treatment  Patient Details  Name: Briana Reid MRN: [id number] Date of Birth: 07/08/1960 Referring Provider (PT): Celesta Gentile   Encounter Date: 11/18/2020   PT End of Session - 11/18/20 1830     Visit Number 3    Number of Visits 13    Date for PT Re-Evaluation 12/18/20    PT Start Time 7867    PT Stop Time 1910    PT Time Calculation (min) 40 min    Equipment Utilized During Treatment Other (comment)   one crutch and CAM boot   Activity Tolerance Patient tolerated treatment well;No increased pain    Behavior During Therapy [facility] for tasks assessed/performed             No past medical history on file.  No past surgical history on file.  There were no vitals filed for this visit.   Subjective Assessment - 11/18/20 1830     Subjective Pt presents to PT with reports of lateral R foot pain. 3/10 pain in R foot. Has been compliant with HEP with no adverse effect. Ready to begin PT at this time.    Currently in Pain? Yes    Pain Score 3            [facility] Adult PT Treatment/Exercise:   Therapeutic Exercise: - towel crunch 2x30 sec - towel in/ev x 5 ea R foot  - Seated heel raises 2x20 - R ankle 4-way YTB 2x10 - standing weight shifts out of boot x 10  - Sit to stand to plinth in CAM boot 3x10 - Standing hip abd/ext 2x10 ea - Step ups x 10 6in fwd ea    Manual Therapy: - NA   Gait training:  - NA   Neuromuscular re-ed: - NA   Therapeutic Activity: - NA   Self-care/Home Management: - NA                                PT Short Term Goals - 11/04/20 1739       PT SHORT TERM GOAL #1   Title Pt will be able to tolerate standing WB through Rt ankle and foot without CAM boot with minimal reports of pain to improve tolerance to ADLs.    Baseline unable     Target Date 11/25/20      PT SHORT TERM GOAL #2   Title Pt will be able to achieve 5 degrees of AROM dorsiflexion while seated to improve weight bearing function of the ankle in standing and walking.    Baseline see flowsheet    Target Date 11/25/20      PT SHORT TERM GOAL #3   Title Pt will be able to perform a sit to stand transfer modified indep without the CAM boot in order to optimize independence with ADLs.    Baseline unable    Target Date 12/16/20               PT Long Term Goals - 11/04/20 1744       PT LONG TERM GOAL #1   Title Pt will be able to walk 50 feet without CAM boot with minimal reports of pain and no increase in swelling to optimize walking capacity.    Baseline unable    Target Date 12/16/20  PT LONG TERM GOAL #2   Title Pt will be able to achieve 5/5 strength in Rt quadriceps and hamstrings to optimize function with walking, transfers, and ADLs.    Baseline 4-    Target Date 12/16/20      PT LONG TERM GOAL #3   Title Pt will be able to achieve 5/5 strength in Rt dorsiflexors without pain to improve stability of the ankle joint and optimize standing function.    Baseline 3-    Target Date 12/16/20      PT LONG TERM GOAL #4   Title Pt will be able to achieve 71% predicted score on FOTO to signify meaningful improvement in functional abilites.    Baseline 58%    Target Date 12/16/20                   Plan - 11/18/20 1835     Clinical Impression Statement Pt was able to complete prescribed exercises with no adverse effect or change in baseline. PT encouraged more WB out of boot today, with pt able to progress to weight shifts without CAM and ambulation without AD. She continues to benefit from skilled PT and remains fearful of WB in R foot without CAM boot. Will continue to progress as tolerated per POC.    PT Treatment/Interventions ADLs/Self Care Home Management;Aquatic Therapy;Cryotherapy;Electrical Stimulation;Moist Heat;Gait  training;Functional mobility training;Therapeutic activities;Therapeutic exercise;Balance training;Neuromuscular re-education;Patient/family education;Passive range of motion;Dry needling;Manual techniques;Taping;Vasopneumatic Device    PT Next Visit Plan Assess response to partial WB, work on ankle AROM and knee strength into flexion and extension. Continue PROM for improved mobility of joints throughout foot and ankle. Consider manual therapy for decreasing muscle guarding during PROM.    PT Home Exercise Plan [id number]             Patient will benefit from skilled therapeutic intervention in order to improve the following deficits and impairments:  Abnormal gait, Decreased range of motion, Difficulty walking, Pain, Decreased activity tolerance, Decreased strength, Increased edema  Visit Diagnosis: Pain in right ankle and joints of right foot  Muscle weakness (generalized)  Stiffness of right ankle, not elsewhere classified  Right foot pain     Problem List Patient Active Problem List   Diagnosis Date Noted   Postmenopausal bleeding 09/07/2020    Ward Chatters, PT 11/19/2020, 3:29 PM  [place], [place], [address] Phone: [phone]   Fax:  [phone]  Name: Briana Reid MRN: [id number] Date of Birth: [birth year]

## 2020-11-22 ENCOUNTER — Encounter

## 2020-11-23 ENCOUNTER — Ambulatory Visit: Admitting: Podiatry

## 2020-11-24 ENCOUNTER — Encounter

## 2020-11-25 ENCOUNTER — Ambulatory Visit (INDEPENDENT_AMBULATORY_CARE_PROVIDER_SITE_OTHER): Admitting: Podiatry

## 2020-11-25 ENCOUNTER — Other Ambulatory Visit: Payer: Self-pay

## 2020-11-25 ENCOUNTER — Ambulatory Visit (INDEPENDENT_AMBULATORY_CARE_PROVIDER_SITE_OTHER)

## 2020-11-25 DIAGNOSIS — S92214D Nondisplaced fracture of cuboid bone of right foot, subsequent encounter for fracture with routine healing: Secondary | ICD-10-CM

## 2020-11-28 NOTE — Progress Notes (Signed)
Subjective: 60 year old female presents the office today for follow-up evaluation of cuboid fracture on the right foot.  States that she is feeling better and not having as much pain.  She is walking in the cam boot she started physical therapy.  She is not taking the gabapentin as she does not feel that she needs it.  Overall she said that she is improving.  She is cleared to walk without the boot.  No recent injury or changes otherwise.  No other concerns.   Objective: AAO x3, NAD DP/PT pulses palpable bilaterally, CRT less than 3 seconds There is still mild tenderness palpation along the early cuboid however it appears to be improved.  There is no specific area of pinpoint tenderness identified otherwise.  Flexor, extensor tendons appear to be intact.  Minimal edema.  There is no erythema or warmth.  Normal skin temperature bilaterally.  No pain to the ankle. 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  -Repeat x-rays obtained and reviewed with her today as well as her husband.  Increased consolidation across the fracture on the cuboid.  No other evidence of acute fracture.  Osteopenia present.  Likely from disuse. -At this point continue cam boot physical therapy.  Discussed she can transition to regular shoe as tolerated.  Continue ice elevate.  Discussed likely transition to a shoe with a shoe with better arch support will be beneficial.  Return in about 4 weeks (around 12/23/2020).  Trula Slade DPM

## 2020-11-29 ENCOUNTER — Ambulatory Visit

## 2020-11-29 DIAGNOSIS — M25571 Pain in right ankle and joints of right foot: Secondary | ICD-10-CM | POA: Diagnosis not present

## 2020-11-29 DIAGNOSIS — M79671 Pain in right foot: Secondary | ICD-10-CM

## 2020-11-29 DIAGNOSIS — M6281 Muscle weakness (generalized): Secondary | ICD-10-CM

## 2020-11-29 DIAGNOSIS — M25671 Stiffness of right ankle, not elsewhere classified: Secondary | ICD-10-CM

## 2020-11-29 NOTE — Therapy (Addendum)
[place], [place], [address] Phone: [phone]   Fax:  [phone]  Physical Therapy Treatment  Patient Details  Name: Briana Reid MRN: [id number] Date of Birth: [birth year] Referring Provider (PT): Celesta Gentile   Encounter Date: 11/29/2020   PT End of Session - 11/29/20 1633     Visit Number 4    Number of Visits 13    Date for PT Re-Evaluation 12/18/20    Authorization Type [employer]    PT Start Time 1545    PT Stop Time 1638   10 minutes Gameready   PT Time Calculation (min) 53 min    Equipment Utilized During Treatment Other (comment)   CAM boot   Activity Tolerance Patient tolerated treatment well;No increased pain    Behavior During Therapy [facility] for tasks assessed/performed             History reviewed. No pertinent past medical history.  History reviewed. No pertinent surgical history.  There were no vitals filed for this visit.   Subjective Assessment - 11/29/20 1551     Subjective Pt said she has been wearing shoes at home instead of her CAM boot and has had minimal pain. Pt says her pain has decreased overall from a 3/10 to 2/10, but that the discoloration in her ankle has stayed the same.    Currently in Pain? Yes    Pain Score 2     Pain Location Ankle    Pain Orientation Right    Pain Descriptors / Indicators Aching    Pain Type Acute pain    Pain Onset More than a month ago    Pain Frequency Intermittent    Aggravating Factors  standing for long time, sitting    Pain Relieving Factors rest    Effect of Pain on Daily Activities difficulty walking without pain, driving                                        PT Education - 11/29/20 1651     Education Details Discussed decreased use of the CAM boot as much as possible with all activities, taking breaks as needed.    Person(s) Educated Patient    Methods Explanation    Comprehension  Verbalized understanding           [facility] Adult PT Treatment/Exercise:  Gait training:  - Walking in parallel bars with VC for decreasing UE support, increased speed, and visual feedback from mirror to improve weight shift over Rt foot.   Therapeutic Exercise:   - Seated heel raises 1x20 - DF/PF rocker board seated 2x15, standing 1x15  - weight shifts anterior, posterior, and lateral at counter on Rt 2x15 each  - Sit to stand to plinth 2x12 hip width, 1x12 greater than hip width - calf raises at leg press machine, 1x10 5lbs, 10lbs, and 15lbs. VC for slowed and controlled return to neutral DF.    *did not perform today  - R ankle 4-way YTB 2x10 - towel crunch 2x30 sec - towel in/ev x 5 ea R foot - Standing hip abd/ext 2x10 ea - Step ups x 10 6in fwd ea   Manual Therapy: - NA     Neuromuscular re-ed: - Rt SLS 4x20secs with two finger touch for support    Therapeutic Activity: - NA   Self-care/Home Management: -  see patient education  Modalities:  - Gameready, 36degrees, supine, elevated Rt ankle, moderate pressure.     PT Short Term Goals - 11/29/20 0930       PT SHORT TERM GOAL #1   Title Pt will be able to tolerate standing WB through Rt ankle and foot without CAM boot with minimal reports of pain to improve tolerance to ADLs.    Baseline unable    Status Achieved    Target Date 11/25/20      PT SHORT TERM GOAL #2   Title Pt will be able to achieve 5 degrees of AROM dorsiflexion while seated to improve weight bearing function of the ankle in standing and walking.    Baseline see flowsheet    Status Deferred    Target Date 11/25/20      PT SHORT TERM GOAL #3   Title Pt will be able to perform a sit to stand transfer modified indep without the CAM boot in order to optimize independence with ADLs.    Baseline unable    Status Achieved    Target Date 12/16/20               PT Long Term Goals - 11/29/20 0931       PT LONG TERM GOAL #1   Title Pt  will be able to walk 50 feet without CAM boot with minimal reports of pain and no increase in swelling to optimize walking capacity.    Baseline unable    Status On-going      PT LONG TERM GOAL #2   Title Pt will be able to achieve 5/5 strength in Rt quadriceps and hamstrings to optimize function with walking, transfers, and ADLs.    Baseline 4-    Status Deferred      PT LONG TERM GOAL #3   Title Pt will be able to achieve 5/5 strength in Rt dorsiflexors without pain to improve stability of the ankle joint and optimize standing function.    Baseline 3-    Status Deferred      PT LONG TERM GOAL #4   Title Pt will be able to achieve 71% predicted score on FOTO to signify meaningful improvement in functional abilites.    Baseline 58%    Status Deferred                   Plan - 11/29/20 1638     Clinical Impression Statement Pt tolerated todays treatment session well with no adverse effects. Pt able to achieve two short term goals by increased WB without the CAM boot without pain and ability to complete sit to stand exercises without pain. Able to progress CKC strengthening, single leg balance, and WB without an AD or CAM boot with no reports of increased pain. Able to complete the entire treatment session without the CAM boot with no increase in pain at the end of the session.    PT Treatment/Interventions ADLs/Self Care Home Management;Aquatic Therapy;Cryotherapy;Electrical Stimulation;Moist Heat;Gait training;Functional mobility training;Therapeutic activities;Therapeutic exercise;Balance training;Neuromuscular re-education;Patient/family education;Passive range of motion;Dry needling;Manual techniques;Taping;Vasopneumatic Device    PT Next Visit Plan Assess response to WB, work on foot intrinsics in standing; Assess ability to drive. Update HEP    PT Home Exercise Plan [id number]             Patient will benefit from skilled therapeutic intervention in order to improve the  following deficits and impairments:  Abnormal gait, Decreased range of motion, Difficulty walking, Pain, Decreased activity tolerance, Decreased strength,  Increased edema  Visit Diagnosis: No diagnosis found.     Problem List Patient Active Problem List   Diagnosis Date Noted   Postmenopausal bleeding 09/07/2020   Glade Lloyd, SPT 11/30/20 9:40 AM   [facility] [address] [place], [place], [address] Phone: [phone]   Fax:  [phone]  Name: Briana Reid MRN: [id number] Date of Birth: 07-07-1960

## 2020-12-01 ENCOUNTER — Ambulatory Visit

## 2020-12-13 ENCOUNTER — Other Ambulatory Visit: Payer: Self-pay

## 2020-12-13 ENCOUNTER — Ambulatory Visit: Attending: Podiatry

## 2020-12-13 DIAGNOSIS — M6281 Muscle weakness (generalized): Secondary | ICD-10-CM | POA: Insufficient documentation

## 2020-12-13 DIAGNOSIS — M25571 Pain in right ankle and joints of right foot: Secondary | ICD-10-CM | POA: Diagnosis not present

## 2020-12-13 DIAGNOSIS — M25671 Stiffness of right ankle, not elsewhere classified: Secondary | ICD-10-CM | POA: Diagnosis present

## 2020-12-13 DIAGNOSIS — M79671 Pain in right foot: Secondary | ICD-10-CM | POA: Insufficient documentation

## 2020-12-13 NOTE — Therapy (Addendum)
[place], [place], [address] Phone: [phone]   Fax:  [phone]  Physical Therapy Treatment/Re-certification  Patient Details  Name: Briana Reid MRN: [id number] Date of Birth: 28-Jul-1960 Referring Provider (PT): Celesta Gentile   Encounter Date: 12/13/2020   PT End of Session - 12/13/20 1710     Visit Number 5    Number of Visits 13    Date for PT Re-Evaluation 01/15/21    Authorization Type [employer]    PT Start Time 1500    PT Stop Time 1555   10 minutes on gameready   PT Time Calculation (min) 55 min    Equipment Utilized During Treatment --    Activity Tolerance Patient tolerated treatment well    Behavior During Therapy [facility] for tasks assessed/performed             History reviewed. No pertinent past medical history.  History reviewed. No pertinent surgical history.  There were no vitals filed for this visit.   Subjective Assessment - 12/13/20 1504     Subjective Pt says that her pain has increased over the last 4 days. Pt says she has not been wearing her CAM boot as much and thought her foot was feeling much better before she got sick. Pt says she tried to still do exercises while she was sick but she was still less active.    Limitations Walking;Standing    How long can you sit comfortably? < 70mnutes    How long can you stand comfortably? 20 minutes    How long can you walk comfortably? 10 minutes    Diagnostic tests X ray    Patient Stated Goals "I want to walk and get better"    Currently in Pain? Yes    Pain Score 3     Pain Location Ankle    Pain Orientation Right    Pain Descriptors / Indicators Aching    Pain Type Acute pain    Pain Onset More than a month ago    Pain Frequency Intermittent    Aggravating Factors  standing, walking    Pain Relieving Factors rest, ice    Effect of Pain on Daily Activities difficulty walking without pain            [facility] Adult  PT Treatment/Exercise:   Gait training:  NA  Therapeutic Exercise:  - Standing heel raises 1x12  - calf stretch 3x30secs  - DF/PF rocker board w/o UE support 3x30secs  - step up/step downs 4 inch box Rt only, 1x12 each       *did not perform today  - R ankle 4-way YTB 2x10 - towel crunch 2x30 sec - towel in/ev x 5 ea R foot - Standing hip abd/ext 2x10 ea - Step ups x 10 6in fwd ea    Manual Therapy: - NA     Neuromuscular re-ed: - EC double leg on airex 3x30secs  - Rt SLS on airex 3x30secs    Therapeutic Activity: - Discussed re-assessment findings, progress towards goals, and expected progression.    Self-care/Home Management: -  NA   Modalities:  - Gameready, 36degrees, supine, elevated Rt ankle, moderate pressure. 10 minutes.      [facility]       Observation/Other Assessments   Observations Rt ankle and foot visually larger than Lt.    Skin Integrity Rt foot darker in appearance than Lt.    Focus on Therapeutic Outcomes (FOTO)  62%      AROM   Right Ankle Dorsiflexion 4    Right Ankle Plantar Flexion 45      Strength   Right Knee Flexion 4+/5    Right Knee Extension 4+/5    Right Ankle Dorsiflexion 4-/5    Right Ankle Plantar Flexion 4-/5    Right Ankle Inversion 4-/5    Right Ankle Eversion 4-/5      Palpation   Palpation comment TTP along lateral border and dorsal aspect of foot and plantar aspect of metatarsals 3-5; palpable edema around cuboid and lateral malleolus      Special Tests   Other special tests 48.0 cm figure 8                                      PT Short Term Goals - 12/14/20 0930       PT SHORT TERM GOAL #1   Title Pt will be able to tolerate standing WB through Rt ankle and foot without CAM boot with minimal reports of pain to improve tolerance to ADLs.    Baseline unable    Status Achieved    Target Date 11/25/20      PT SHORT TERM GOAL #2   Title Pt will be able to  achieve 5 degrees of AROM dorsiflexion while seated to improve weight bearing function of the ankle in standing and walking.    Baseline see flowsheet    Status On-going    Target Date 11/25/20      PT SHORT TERM GOAL #3   Title Pt will be able to perform a sit to stand transfer modified indep without the CAM boot in order to optimize independence with ADLs.    Baseline unable    Status Achieved    Target Date 12/16/20               PT Long Term Goals - 12/13/20 0931       PT LONG TERM GOAL #1   Title Pt will be able to walk 50 feet without CAM boot with minimal reports of pain and no increase in swelling to optimize walking capacity.    Baseline unable    Status On-going      PT LONG TERM GOAL #2   Title Pt will be able to achieve 5/5 strength in Rt quadriceps and hamstrings to optimize function with walking, transfers, and ADLs.    Baseline 4-    Status On-going      PT LONG TERM GOAL #3   Title Pt will be able to achieve 5/5 strength in Rt dorsiflexors without pain to improve stability of the ankle joint and optimize standing function.    Baseline 3-    Status On-going      PT LONG TERM GOAL #4   Title Pt will be able to achieve 71% predicted score on FOTO to signify meaningful improvement in functional abilites.    Baseline 58%    Status On-going                   Plan - 12/14/20 0906     Clinical Impression Statement Pt tolerated todays treatment session well with no adverse effects. Pt has met 2/3 short term goals and has made good improvements towards long term goals at this time. Pt displays significant improvements compared to initial visit in Rt LE strength and Rt  ankle mobility; however, pt continues to have pain and edema with Wb without CAM boot, decreased WB tolerance on Rt LE, and decreased Rt ankle DF. Pt will benefit from skliled physical therapy to address remaining deficits and improve pain management.    PT Frequency Other (comment)   1-2 a  week   PT Duration 4 weeks    PT Treatment/Interventions ADLs/Self Care Home Management;Aquatic Therapy;Cryotherapy;Electrical Stimulation;Moist Heat;Gait training;Functional mobility training;Therapeutic activities;Therapeutic exercise;Balance training;Neuromuscular re-education;Patient/family education;Passive range of motion;Dry needling;Manual techniques;Taping;Vasopneumatic Device    PT Next Visit Plan Assess response to WB, work on foot intrinsics in standing; Assess ability to drive. Update HEP    PT Home Exercise Plan [id number]    Consulted and Agree with Plan of Care Patient             Patient will benefit from skilled therapeutic intervention in order to improve the following deficits and impairments:  Abnormal gait, Decreased range of motion, Difficulty walking, Pain, Decreased activity tolerance, Decreased strength, Increased edema  Visit Diagnosis: No diagnosis found.     Problem List Patient Active Problem List   Diagnosis Date Noted   Postmenopausal bleeding 09/07/2020    Glade Lloyd, SPT 12/14/20 11:58 AM   [place] [facility] [address] [place], [place], [address] Phone: [phone]   Fax:  [phone]  Name: Briana Reid MRN: [id number] Date of Birth: [birth year]

## 2020-12-15 ENCOUNTER — Other Ambulatory Visit: Payer: Self-pay

## 2020-12-15 ENCOUNTER — Ambulatory Visit

## 2020-12-15 DIAGNOSIS — M25571 Pain in right ankle and joints of right foot: Secondary | ICD-10-CM

## 2020-12-15 DIAGNOSIS — M25671 Stiffness of right ankle, not elsewhere classified: Secondary | ICD-10-CM

## 2020-12-15 DIAGNOSIS — M6281 Muscle weakness (generalized): Secondary | ICD-10-CM

## 2020-12-15 DIAGNOSIS — M79671 Pain in right foot: Secondary | ICD-10-CM

## 2020-12-15 NOTE — Therapy (Signed)
[place], [place], [address] Phone: [phone]   Fax:  [phone]  Physical Therapy Treatment  Patient Details  Name: Briana Reid MRN: [id number] Date of Birth: 05-04-1960 Referring Provider (PT): Celesta Gentile   Encounter Date: 12/15/2020   PT End of Session - 12/15/20 1529     Visit Number 6    Number of Visits 13    Date for PT Re-Evaluation 01/15/21    Authorization Type [employer]    PT Start Time 1530    PT Stop Time 1610    PT Time Calculation (min) 40 min    Activity Tolerance Patient tolerated treatment well    Behavior During Therapy [facility] for tasks assessed/performed             History reviewed. No pertinent past medical history.  History reviewed. No pertinent surgical history.  There were no vitals filed for this visit.   Subjective Assessment - 12/15/20 1530     Subjective Patient reports her foot is feeling ok today without reports of pain. She reports compliance with HEP.    Currently in Pain? No/denies              [facility] Adult PT Treatment/Exercise:   Gait training:  N/A   Therapeutic Exercise:  - Treadmill speed 1.0 at 10% incline x 5 minutes   - calf stretch on wedge 60 sec  - calf raises on airex 2 x 10  - DF/PF rocker board 2 x 20  - resisted ankle dorsiflexion,eversion, inversion 2 x 10 blue band       *did not perform today  - step up/step downs 4 inch box Rt only, 1x12 each  - towel crunch 2x30 sec - towel in/ev x 5 ea R foot - Standing hip abd/ext 2x10 ea - Step ups x 10 6in fwd ea       Neuromuscular re-ed: - standing marble pickups  8 marbles bilaterally  - Rt SLS on airex 3x30secs  - opposite touchdown 11 tall cones 2 x 10   Not today: - EC double leg on airex 3x30secs     Self-care/Home Management: -  NA                               PT Short Term Goals - 12/14/20 0930       PT SHORT TERM GOAL #1    Title Pt will be able to tolerate standing WB through Rt ankle and foot without CAM boot with minimal reports of pain to improve tolerance to ADLs.    Baseline unable    Status Achieved    Target Date 11/25/20      PT SHORT TERM GOAL #2   Title Pt will be able to achieve 5 degrees of AROM dorsiflexion while seated to improve weight bearing function of the ankle in standing and walking.    Baseline see flowsheet    Status On-going    Target Date 11/25/20      PT SHORT TERM GOAL #3   Title Pt will be able to perform a sit to stand transfer modified indep without the CAM boot in order to optimize independence with ADLs.    Baseline unable    Status Achieved    Target Date 12/16/20               PT Long Term Goals - 12/13/20 [phone]  PT LONG TERM GOAL #1   Title Pt will be able to walk 50 feet without CAM boot with minimal reports of pain and no increase in swelling to optimize walking capacity.    Baseline unable    Status On-going      PT LONG TERM GOAL #2   Title Pt will be able to achieve 5/5 strength in Rt quadriceps and hamstrings to optimize function with walking, transfers, and ADLs.    Baseline 4-    Status On-going      PT LONG TERM GOAL #3   Title Pt will be able to achieve 5/5 strength in Rt dorsiflexors without pain to improve stability of the ankle joint and optimize standing function.    Baseline 3-    Status On-going      PT LONG TERM GOAL #4   Title Pt will be able to achieve 71% predicted score on FOTO to signify meaningful improvement in functional abilites.    Baseline 58%    Status On-going                   Plan - 12/15/20 1535     Clinical Impression Statement Patient tolerated session well today with continued progression of foot/ankle strengthening and initiation of dynamic balance activity. Initial discomfort about lateral foot with calf raises, though when instructed to push more through the medial aspect of the foot/great toe  this discomfort resolved. She remains challenged with static and dynamic balance activity requiring occasional UE support to maintain balance.    PT Frequency Other (comment)   1-2 a week   PT Duration 4 weeks    PT Treatment/Interventions ADLs/Self Care Home Management;Aquatic Therapy;Cryotherapy;Electrical Stimulation;Moist Heat;Gait training;Functional mobility training;Therapeutic activities;Therapeutic exercise;Balance training;Neuromuscular re-education;Patient/family education;Passive range of motion;Dry needling;Manual techniques;Taping;Vasopneumatic Device    PT Next Visit Plan continue ankle/foot strengthening and balance    PT Home Exercise Plan [id number]    Consulted and Agree with Plan of Care Patient             Patient will benefit from skilled therapeutic intervention in order to improve the following deficits and impairments:  Abnormal gait, Decreased range of motion, Difficulty walking, Pain, Decreased activity tolerance, Decreased strength, Increased edema  Visit Diagnosis: Pain in right ankle and joints of right foot  Muscle weakness (generalized)  Stiffness of right ankle, not elsewhere classified  Right foot pain     Problem List Patient Active Problem List   Diagnosis Date Noted   Postmenopausal bleeding 09/07/2020   Briana Reid, PT, DPT, ATC 12/15/20 4:12 PM   [place] [facility] [address] [place], [place], [address] Phone: [phone]   Fax:  [phone]  Name: Briana Reid MRN: [id number] Date of Birth: 05-27-1960

## 2020-12-21 ENCOUNTER — Other Ambulatory Visit: Payer: Self-pay

## 2020-12-21 ENCOUNTER — Ambulatory Visit

## 2020-12-21 DIAGNOSIS — M25571 Pain in right ankle and joints of right foot: Secondary | ICD-10-CM

## 2020-12-21 DIAGNOSIS — M6281 Muscle weakness (generalized): Secondary | ICD-10-CM

## 2020-12-21 DIAGNOSIS — M79671 Pain in right foot: Secondary | ICD-10-CM

## 2020-12-21 DIAGNOSIS — M25671 Stiffness of right ankle, not elsewhere classified: Secondary | ICD-10-CM

## 2020-12-21 NOTE — Therapy (Addendum)
[place], [place], [address] Phone: [phone]   Fax:  [phone]  Physical Therapy Treatment  Patient Details  Name: Briana Reid MRN: [id number] Date of Birth: 03/24/1960 Referring Provider (PT): Celesta Gentile   Encounter Date: 12/21/2020   PT End of Session - 12/21/20 1748     Visit Number 7    Number of Visits 13    Date for PT Re-Evaluation 01/15/21    Authorization Type [employer]    PT Start Time 1700    PT Stop Time 9678    PT Time Calculation (min) 43 min    Activity Tolerance Patient tolerated treatment well    Behavior During Therapy [facility] for tasks assessed/performed             History reviewed. No pertinent past medical history.  History reviewed. No pertinent surgical history.  There were no vitals filed for this visit.   Subjective Assessment - 12/21/20 1702     Subjective Patient reports she is feeling good and denies any pain currently. Pt says when she walks alot throughout the day she puts on her CAM boot because her foot feels achy.    Currently in Pain? No/denies             [facility] Adult PT Treatment/Exercise:   Gait training:  N/A   Therapeutic Exercise:  - Treadmill speed 1.2 at 10% incline x 5 minutes   - calf stretch on wedge 60 sec  - double calf raises 2 x 10  - Forward and reverse lunges 1x10 each, each LE - double leg jump down from 2 inch step, 2x15 -forward lunges on BOSU ball 1x10 BLE - leg press heel raises 1x12 15 lbs RLE - SL leg press RLE 1x15, 35 lbs          *did not perform today  - step up/step downs 4 inch box Rt only, 1x12 each  - towel crunch 2x30 sec - towel in/ev x 5 ea R foot - Standing hip abd/ext 2x10 ea   - resisted ankle dorsiflexion,eversion, inversion 2 x 10 blue band       Neuromuscular re-ed: - SL balance Rt 2x30secs  - SL balance on BOSU 2x30secs Rt  -  Step ups with contralateral hip flex 6in fwd each 1x15  RLE, 1x15 8 inch RLE -SL balance with tennis ball bounces 2x30secs RLE   Not today: - EC double leg on airex 3x30secs      Self-care/Home Management: - NA                                                   PT Short Term Goals - 12/14/20 0930       PT SHORT TERM GOAL #1   Title Pt will be able to tolerate standing WB through Rt ankle and foot without CAM boot with minimal reports of pain to improve tolerance to ADLs.    Baseline unable    Status Achieved    Target Date 11/25/20      PT SHORT TERM GOAL #2   Title Pt will be able to achieve 5 degrees of AROM dorsiflexion while seated to improve weight bearing function of the ankle in standing and walking.    Baseline see flowsheet    Status On-going    Target  Date 11/25/20      PT SHORT TERM GOAL #3   Title Pt will be able to perform a sit to stand transfer modified indep without the CAM boot in order to optimize independence with ADLs.    Baseline unable    Status Achieved    Target Date 12/16/20               PT Long Term Goals - 12/13/20 0931       PT LONG TERM GOAL #1   Title Pt will be able to walk 50 feet without CAM boot with minimal reports of pain and no increase in swelling to optimize walking capacity.    Baseline unable    Status On-going      PT LONG TERM GOAL #2   Title Pt will be able to achieve 5/5 strength in Rt quadriceps and hamstrings to optimize function with walking, transfers, and ADLs.    Baseline 4-    Status On-going      PT LONG TERM GOAL #3   Title Pt will be able to achieve 5/5 strength in Rt dorsiflexors without pain to improve stability of the ankle joint and optimize standing function.    Baseline 3-    Status On-going      PT LONG TERM GOAL #4   Title Pt will be able to achieve 71% predicted score on FOTO to signify meaningful improvement in functional abilites.    Baseline 58%    Status On-going                   Plan - 12/21/20  1754     Clinical Impression Statement Patient tolerated session well today with continued progression of foot/ankle strenghtening, dynamic balance activities, and initiation of plyometrics for improving power. Pt reports no discomfort with calf raises w/ minimal verbal cues required at the beginning for WB through balls of the feet. She remains challenged with static and dynamic balance activities requiring two finger touch occassionally. Pt reports no pain and minimal fatigue in BLE at the end of session.    PT Treatment/Interventions ADLs/Self Care Home Management;Aquatic Therapy;Cryotherapy;Electrical Stimulation;Moist Heat;Gait training;Functional mobility training;Therapeutic activities;Therapeutic exercise;Balance training;Neuromuscular re-education;Patient/family education;Passive range of motion;Dry needling;Manual techniques;Taping;Vasopneumatic Device    PT Next Visit Plan continue ankle/foot strengthening and balance    PT Home Exercise Plan [id number]    Consulted and Agree with Plan of Care Patient             Patient will benefit from skilled therapeutic intervention in order to improve the following deficits and impairments:  Abnormal gait, Decreased range of motion, Difficulty walking, Pain, Decreased activity tolerance, Decreased strength, Increased edema  Visit Diagnosis: No diagnosis found.     Problem List Patient Active Problem List   Diagnosis Date Noted   Postmenopausal bleeding 09/07/2020   Glade Lloyd, SPT 12/21/20 6:05 PM   [facility] [facility] [address] [place], [place], [address] Phone: [phone]   Fax:  [phone]  Name: Briana Reid MRN: [id number] Date of Birth: 1960/12/17

## 2020-12-23 ENCOUNTER — Ambulatory Visit (INDEPENDENT_AMBULATORY_CARE_PROVIDER_SITE_OTHER)

## 2020-12-23 ENCOUNTER — Other Ambulatory Visit: Payer: Self-pay

## 2020-12-23 ENCOUNTER — Ambulatory Visit (INDEPENDENT_AMBULATORY_CARE_PROVIDER_SITE_OTHER): Admitting: Podiatry

## 2020-12-23 DIAGNOSIS — S92214D Nondisplaced fracture of cuboid bone of right foot, subsequent encounter for fracture with routine healing: Secondary | ICD-10-CM

## 2020-12-23 DIAGNOSIS — M7752 Other enthesopathy of left foot: Secondary | ICD-10-CM

## 2020-12-26 NOTE — Progress Notes (Signed)
Subjective: 60 year old female presents the office today for follow-up evaluation of cuboid fracture on the right foot.  She said that she has made much improvement compared to last appointment.  She is still in physical therapy.  She is also back to wearing regular shoe.  She states that her activity is much improved as well as her movement.  Still able to walk a long distance without some soreness but otherwise improving.  No recent injuries or changes otherwise.  No other concerns.   Objective: AAO x3, NAD DP/PT pulses palpable bilaterally, CRT less than 3 seconds Unable to elicit any area of pinpoint tenderness on the right side.  There is in particular is noted on the cuboid.  Flexor, extensor tendons appear to be intact.  Normal temperature gradient bilaterally as well as a normal color. She did some discomfort the dorsal aspect of the left foot along the area of bone spur from shoes.  No recent injury.  No significant soft tissue mass today. 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  -X-rays obtained reviewed.  No evidence of acute fracture in the area of the cuboid appears to be healing. -At this point continue physical therapy and gradually increase activity level.  Regular shoe as tolerated. -On the left foot continue to offload the area we discussed releasing her shoes to avoid any pressure.  Does not cause significant discomfort or impede her from activity.  Return in about 6 weeks (around 02/03/2021), or if symptoms worsen or fail to improve.  Trula Slade DPM

## 2020-12-28 ENCOUNTER — Other Ambulatory Visit: Payer: Self-pay

## 2020-12-28 ENCOUNTER — Ambulatory Visit

## 2020-12-28 DIAGNOSIS — M25571 Pain in right ankle and joints of right foot: Secondary | ICD-10-CM

## 2020-12-28 DIAGNOSIS — M6281 Muscle weakness (generalized): Secondary | ICD-10-CM

## 2020-12-28 DIAGNOSIS — M25671 Stiffness of right ankle, not elsewhere classified: Secondary | ICD-10-CM

## 2020-12-28 DIAGNOSIS — M79671 Pain in right foot: Secondary | ICD-10-CM

## 2020-12-28 NOTE — Therapy (Signed)
[place], [place], [address] Phone: [phone]   Fax:  [phone]  Physical Therapy Treatment  Patient Details  Name: Briana Reid MRN: [id number] Date of Birth: [birth year] Referring Provider (PT): Celesta Gentile   Encounter Date: 12/28/2020   PT End of Session - 12/28/20 1654     Visit Number 8    Number of Visits 13    Date for PT Re-Evaluation 01/15/21    Authorization Type [employer]    PT Start Time 2703    PT Stop Time 1742    PT Time Calculation (min) 44 min    Activity Tolerance Patient tolerated treatment well    Behavior During Therapy [facility] for tasks assessed/performed             No past medical history on file.  No past surgical history on file.  There were no vitals filed for this visit.   Subjective Assessment - 12/28/20 1654     Subjective Pt presents to PT with reports of slight discomfort in R ankle. Has been compliant with HEP with no adverse effect. Pt is ready to begin PT at this time.    Currently in Pain? Yes    Pain Score 1     Pain Location Ankle    Pain Orientation Right           Therapeutic Exercise: Treadmill speed 1.4 at 10% incline x 5 minutes   Calf stretch on wedge 3x45" Double calf raises 2x15  Repeated forward lunge to 2in step and foam 2x10 ea Forward lunges on BOSU ball x10 BLE Leg press heel raises 2x15 20lbs RLE SL leg press B LE 2x10 40 lbs   *did not perform today  step up/step downs 4 inch box Rt only, 1x12 each  towel crunch 2x30 sec towel in/ev x 5 ea R foot Standing hip abd/ext 2x10 ea resisted ankle dorsiflexion,eversion, inversion 2 x 10 blue band Double leg jump down from 2 inch step, 2x15  Neuromuscular re-ed: SL balance Rt 2x30" R SLS on foam 3x30" SL balance on BOSU 2x30" Rt Step ups with contralateral hip flex x 15 ea - 8in step Rocker board fwd/lat x 10 ea                                 PT Short Term Goals - 12/14/20 0930       PT SHORT TERM GOAL #1   Title Pt will be able to tolerate standing WB through Rt ankle and foot without CAM boot with minimal reports of pain to improve tolerance to ADLs.    Baseline unable    Status Achieved    Target Date 11/25/20      PT SHORT TERM GOAL #2   Title Pt will be able to achieve 5 degrees of AROM dorsiflexion while seated to improve weight bearing function of the ankle in standing and walking.    Baseline see flowsheet    Status On-going    Target Date 11/25/20      PT SHORT TERM GOAL #3   Title Pt will be able to perform a sit to stand transfer modified indep without the CAM boot in order to optimize independence with ADLs.    Baseline unable    Status Achieved    Target Date 12/16/20               PT Long  Term Goals - 12/13/20 0931       PT LONG TERM GOAL #1   Title Pt will be able to walk 50 feet without CAM boot with minimal reports of pain and no increase in swelling to optimize walking capacity.    Baseline unable    Status On-going      PT LONG TERM GOAL #2   Title Pt will be able to achieve 5/5 strength in Rt quadriceps and hamstrings to optimize function with walking, transfers, and ADLs.    Baseline 4-    Status On-going      PT LONG TERM GOAL #3   Title Pt will be able to achieve 5/5 strength in Rt dorsiflexors without pain to improve stability of the ankle joint and optimize standing function.    Baseline 3-    Status On-going      PT LONG TERM GOAL #4   Title Pt will be able to achieve 71% predicted score on FOTO to signify meaningful improvement in functional abilites.    Baseline 58%    Status On-going                   Plan - 12/28/20 1812     Clinical Impression Statement Pt was able to complete all prescribed exercises with no adverse effect or increase in pain. Therapy today continued to focus on progressing ankle stability/proprioception, LE strength, and general activity  tolerance. Pt continues to progress very well with therapy overall, showing improving strength and R ankle stability today. She continues to benefit from skilled PT services and should continue to be seen and progressed as tolerated.    PT Treatment/Interventions ADLs/Self Care Home Management;Aquatic Therapy;Cryotherapy;Electrical Stimulation;Moist Heat;Gait training;Functional mobility training;Therapeutic activities;Therapeutic exercise;Balance training;Neuromuscular re-education;Patient/family education;Passive range of motion;Dry needling;Manual techniques;Taping;Vasopneumatic Device    PT Next Visit Plan continue ankle/foot strengthening and balance    PT Home Exercise Plan [id number]             Patient will benefit from skilled therapeutic intervention in order to improve the following deficits and impairments:  Abnormal gait, Decreased range of motion, Difficulty walking, Pain, Decreased activity tolerance, Decreased strength, Increased edema  Visit Diagnosis: Pain in right ankle and joints of right foot  Muscle weakness (generalized)  Stiffness of right ankle, not elsewhere classified  Right foot pain     Problem List Patient Active Problem List   Diagnosis Date Noted   Postmenopausal bleeding 09/07/2020    Ward Chatters, PT 12/28/2020, [facility] [address] [place], [place], [address] Phone: [phone]   Fax:  [phone]  Name: Briana Reid MRN: [id number] Date of Birth: 01-Oct-1960

## 2020-12-30 ENCOUNTER — Other Ambulatory Visit: Payer: Self-pay

## 2020-12-30 ENCOUNTER — Ambulatory Visit

## 2020-12-30 DIAGNOSIS — M25571 Pain in right ankle and joints of right foot: Secondary | ICD-10-CM | POA: Diagnosis not present

## 2020-12-30 DIAGNOSIS — M25671 Stiffness of right ankle, not elsewhere classified: Secondary | ICD-10-CM

## 2020-12-30 DIAGNOSIS — M6281 Muscle weakness (generalized): Secondary | ICD-10-CM

## 2020-12-30 DIAGNOSIS — M79671 Pain in right foot: Secondary | ICD-10-CM

## 2020-12-30 NOTE — Therapy (Signed)
[place], [place], [address] Phone: [phone]   Fax:  [phone]  Physical Therapy Treatment  Patient Details  Name: Briana Reid MRN: [id number] Date of Birth: 07-20-1960 Referring Provider (PT): Celesta Gentile   Encounter Date: 12/30/2020   PT End of Session - 12/30/20 1615     Visit Number 9    Number of Visits 13    Date for PT Re-Evaluation 01/15/21    Authorization Type [employer]    PT Start Time 1615    Activity Tolerance Patient tolerated treatment well    Behavior During Therapy [facility] for tasks assessed/performed             No past medical history on file.  No past surgical history on file.  There were no vitals filed for this visit.   Subjective Assessment - 12/30/20 1615     Subjective Pt presents to PT with no current reports of R ankle pain or discomfort. She has continued to be compliant with HEP with no adverse effect. Pt is ready to begin PT treatment at this time.    Currently in Pain? No/denies    Pain Score 0-No pain           [facility] Adult PT Treatment/Exercise: Therapeutic Exercise: Treadmill speed 1.7 at 10% incline x 5 minutes   Double Heel raise 2in step 2x15  Calf stretch on wedge 3x45" Repeated forward lunge to 2in step and foam 2x10 ea Goblet squat 2x10 10lb KB Deadlift 2x10 10lb KB Forward lunges on BOSU ball x10 BLE Leg press heel raises 2x15 20lbs RLE Leg press B LE 2x10 85lbs   *did not perform today  step up/step downs 4 inch box Rt only, 1x12 each  towel crunch 2x30 sec towel in/ev x 5 ea R foot Standing hip abd/ext 2x10 ea resisted ankle dorsiflexion,eversion, inversion 2 x 10 blue band Double leg jump down from 2 inch step, 2x15   Neuromuscular re-ed: R SLS on foam 3x30" Step ups with contralateral hip flex x 15 ea - 8in step Rocker board fwd/lat x 10 ea                               PT Short Term Goals -  12/14/20 0930       PT SHORT TERM GOAL #1   Title Pt will be able to tolerate standing WB through Rt ankle and foot without CAM boot with minimal reports of pain to improve tolerance to ADLs.    Baseline unable    Status Achieved    Target Date 11/25/20      PT SHORT TERM GOAL #2   Title Pt will be able to achieve 5 degrees of AROM dorsiflexion while seated to improve weight bearing function of the ankle in standing and walking.    Baseline see flowsheet    Status On-going    Target Date 11/25/20      PT SHORT TERM GOAL #3   Title Pt will be able to perform a sit to stand transfer modified indep without the CAM boot in order to optimize independence with ADLs.    Baseline unable    Status Achieved    Target Date 12/16/20               PT Long Term Goals - 12/13/20 0931       PT LONG TERM GOAL #1   Title  Pt will be able to walk 50 feet without CAM boot with minimal reports of pain and no increase in swelling to optimize walking capacity.    Baseline unable    Status On-going      PT LONG TERM GOAL #2   Title Pt will be able to achieve 5/5 strength in Rt quadriceps and hamstrings to optimize function with walking, transfers, and ADLs.    Baseline 4-    Status On-going      PT LONG TERM GOAL #3   Title Pt will be able to achieve 5/5 strength in Rt dorsiflexors without pain to improve stability of the ankle joint and optimize standing function.    Baseline 3-    Status On-going      PT LONG TERM GOAL #4   Title Pt will be able to achieve 71% predicted score on FOTO to signify meaningful improvement in functional abilites.    Baseline 58%    Status On-going                   Plan - 12/30/20 1622     Clinical Impression Statement Pt    PT Treatment/Interventions ADLs/Self Care Home Management;Aquatic Therapy;Cryotherapy;Electrical Stimulation;Moist Heat;Gait training;Functional mobility training;Therapeutic activities;Therapeutic exercise;Balance  training;Neuromuscular re-education;Patient/family education;Passive range of motion;Dry needling;Manual techniques;Taping;Vasopneumatic Device             Patient will benefit from skilled therapeutic intervention in order to improve the following deficits and impairments:  Abnormal gait, Decreased range of motion, Difficulty walking, Pain, Decreased activity tolerance, Decreased strength, Increased edema  Visit Diagnosis: Pain in right ankle and joints of right foot  Muscle weakness (generalized)  Stiffness of right ankle, not elsewhere classified  Right foot pain     Problem List Patient Active Problem List   Diagnosis Date Noted   Postmenopausal bleeding 09/07/2020    Ward Chatters, PT 12/30/2020, 4:57 PM  [facility] [address] [place], [place], [address] Phone: [phone]   Fax:  [phone]  Name: Briana Reid MRN: [id number] Date of Birth: 1960/02/05

## 2021-01-05 ENCOUNTER — Other Ambulatory Visit: Payer: Self-pay

## 2021-01-05 ENCOUNTER — Ambulatory Visit

## 2021-01-05 DIAGNOSIS — M25671 Stiffness of right ankle, not elsewhere classified: Secondary | ICD-10-CM

## 2021-01-05 DIAGNOSIS — M25571 Pain in right ankle and joints of right foot: Secondary | ICD-10-CM

## 2021-01-05 DIAGNOSIS — M6281 Muscle weakness (generalized): Secondary | ICD-10-CM

## 2021-01-05 DIAGNOSIS — M79671 Pain in right foot: Secondary | ICD-10-CM

## 2021-01-05 NOTE — Therapy (Signed)
[place], [place], [address] Phone: [phone]   Fax:  [phone]  Physical Therapy Treatment  Patient Details  Name: Briana Reid MRN: [id number] Date of Birth: [birth year] Referring Provider (PT): Celesta Gentile   Encounter Date: 01/05/2021   PT End of Session - 01/05/21 1438     Visit Number 10    Number of Visits 13    Date for PT Re-Evaluation 01/15/21    Authorization Type [employer]    PT Start Time 1440    PT Stop Time 1518    PT Time Calculation (min) 38 min    Activity Tolerance Patient tolerated treatment well    Behavior During Therapy [facility] for tasks assessed/performed             No past medical history on file.  No past surgical history on file.  There were no vitals filed for this visit.   Subjective Assessment - 01/05/21 1438     Subjective Pt presents to PT with no current reports of R ankle pain or discomfort. She has continued to be compliant with HEP with no adverse effect. Pt is ready to begin PT treatment at this time.    Currently in Pain? Yes    Pain Score 0-No pain           [facility] Adult PT Treatment/Exercise: Therapeutic Exercise: Treadmill speed 1.5 at 10% incline x 5 minutes   Double Heel raise 4in step 2x15  Calf stretch on wedge 3x45" Goblet squat 2x10 15lb KB Deadlift 2x10 15lb KB - to 8in step Standing hip abd 2x10 17.5lb Leg press heel raises 2x15 25lbs RLE Leg press B LE 2x10 95lbs   Neuromuscular re-ed: R SLS on foam 3x30" Step ups with contralateral hip flex x 15 ea - 8in step Rocker board fwd/lat x 10 ea                               PT Short Term Goals - 12/14/20 0930       PT SHORT TERM GOAL #1   Title Pt will be able to tolerate standing WB through Rt ankle and foot without CAM boot with minimal reports of pain to improve tolerance to ADLs.    Baseline unable    Status Achieved    Target Date 11/25/20       PT SHORT TERM GOAL #2   Title Pt will be able to achieve 5 degrees of AROM dorsiflexion while seated to improve weight bearing function of the ankle in standing and walking.    Baseline see flowsheet    Status On-going    Target Date 11/25/20      PT SHORT TERM GOAL #3   Title Pt will be able to perform a sit to stand transfer modified indep without the CAM boot in order to optimize independence with ADLs.    Baseline unable    Status Achieved    Target Date 12/16/20               PT Long Term Goals - 12/13/20 0931       PT LONG TERM GOAL #1   Title Pt will be able to walk 50 feet without CAM boot with minimal reports of pain and no increase in swelling to optimize walking capacity.    Baseline unable    Status On-going      PT LONG TERM GOAL #  2   Title Pt will be able to achieve 5/5 strength in Rt quadriceps and hamstrings to optimize function with walking, transfers, and ADLs.    Baseline 4-    Status On-going      PT LONG TERM GOAL #3   Title Pt will be able to achieve 5/5 strength in Rt dorsiflexors without pain to improve stability of the ankle joint and optimize standing function.    Baseline 3-    Status On-going      PT LONG TERM GOAL #4   Title Pt will be able to achieve 71% predicted score on FOTO to signify meaningful improvement in functional abilites.    Baseline 58%    Status On-going                   Plan - 01/05/21 1443     Clinical Impression Statement Pt was again able to again complete all prescribed exercises with no adverse effect or increase in pain. She continues to improve functional strength and mobility, with pt able to progress well again today with therapy overall. Pt should continue to be seen per POC as prescribed and progressed as able.    PT Treatment/Interventions ADLs/Self Care Home Management;Aquatic Therapy;Cryotherapy;Electrical Stimulation;Moist Heat;Gait training;Functional mobility training;Therapeutic  activities;Therapeutic exercise;Balance training;Neuromuscular re-education;Patient/family education;Passive range of motion;Dry needling;Manual techniques;Taping;Vasopneumatic Device    PT Next Visit Plan continue ankle/foot strengthening and balance    PT Home Exercise Plan [id number]             Patient will benefit from skilled therapeutic intervention in order to improve the following deficits and impairments:  Abnormal gait, Decreased range of motion, Difficulty walking, Pain, Decreased activity tolerance, Decreased strength, Increased edema  Visit Diagnosis: Pain in right ankle and joints of right foot  Muscle weakness (generalized)  Stiffness of right ankle, not elsewhere classified  Right foot pain     Problem List Patient Active Problem List   Diagnosis Date Noted   Postmenopausal bleeding 09/07/2020    Ward Chatters, PT 01/05/2021, 3:20 PM  [facility] [address] [place], [place], [address] Phone: [phone]   Fax:  [phone]  Name: Briana Reid MRN: [id number] Date of Birth: 26-Oct-1960

## 2021-01-11 ENCOUNTER — Ambulatory Visit: Attending: Podiatry

## 2021-01-11 ENCOUNTER — Other Ambulatory Visit: Payer: Self-pay

## 2021-01-11 DIAGNOSIS — M25671 Stiffness of right ankle, not elsewhere classified: Secondary | ICD-10-CM

## 2021-01-11 DIAGNOSIS — M25571 Pain in right ankle and joints of right foot: Secondary | ICD-10-CM

## 2021-01-11 DIAGNOSIS — M6281 Muscle weakness (generalized): Secondary | ICD-10-CM | POA: Diagnosis present

## 2021-01-11 DIAGNOSIS — M79671 Pain in right foot: Secondary | ICD-10-CM | POA: Diagnosis present

## 2021-01-11 NOTE — Therapy (Signed)
[place], [place], [address] Phone: [phone]   Fax:  [phone]  Physical Therapy Treatment  Patient Details  Name: Briana Reid MRN: [id number] Date of Birth: 1960-01-19 Referring Provider (PT): Celesta Gentile   Encounter Date: 01/11/2021   PT End of Session - 01/11/21 1530     Visit Number 11    Number of Visits 13    Date for PT Re-Evaluation 01/15/21    Authorization Type [employer]    PT Start Time 1530    PT Stop Time 1610    PT Time Calculation (min) 40 min    Activity Tolerance Patient tolerated treatment well    Behavior During Therapy [facility] for tasks assessed/performed             No past medical history on file.  No past surgical history on file.  There were no vitals filed for this visit.   Subjective Assessment - 01/11/21 1531     Subjective Pt presents to PT with no current R foot/ankle pain. Continues HEP compliance, has started performing plyometric exercise as well with no adverse effect .Pt is ready to begim PT treatment at this time.    Currently in Pain? No/denies    Pain Score 0-No pain           [facility] Adult PT Treatment/Exercise: Therapeutic Exercise: Treadmill speed 2.2 at 10% incline x 5 minutes  Plyo jumps lat/fwd x 15 ea Single leg heel raise x 15 ea  Double Heel raise 4in step 2x15  Calf stretch on wedge 3x45" Step up R leading with plyo jump off 8 in x 10 Lunge 2x55ft Goblet squat 2x10 15lb KB Deadlift 2x10 15lb KB - to 8in step Standing hip abd 2x10 25lb Leg press heel raises 2x15 25lbs RLE Leg press B LE 2x10 85lbs   Neuromuscular re-ed: R SLS on foam 3x30" Rocker board fwd/lat x 10 ea                               PT Short Term Goals - 12/14/20 0930       PT SHORT TERM GOAL #1   Title Pt will be able to tolerate standing WB through Rt ankle and foot without CAM boot with minimal reports of pain to improve  tolerance to ADLs.    Baseline unable    Status Achieved    Target Date 11/25/20      PT SHORT TERM GOAL #2   Title Pt will be able to achieve 5 degrees of AROM dorsiflexion while seated to improve weight bearing function of the ankle in standing and walking.    Baseline see flowsheet    Status On-going    Target Date 11/25/20      PT SHORT TERM GOAL #3   Title Pt will be able to perform a sit to stand transfer modified indep without the CAM boot in order to optimize independence with ADLs.    Baseline unable    Status Achieved    Target Date 12/16/20               PT Long Term Goals - 12/13/20 0931       PT LONG TERM GOAL #1   Title Pt will be able to walk 50 feet without CAM boot with minimal reports of pain and no increase in swelling to optimize walking capacity.    Baseline unable  Status On-going      PT LONG TERM GOAL #2   Title Pt will be able to achieve 5/5 strength in Rt quadriceps and hamstrings to optimize function with walking, transfers, and ADLs.    Baseline 4-    Status On-going      PT LONG TERM GOAL #3   Title Pt will be able to achieve 5/5 strength in Rt dorsiflexors without pain to improve stability of the ankle joint and optimize standing function.    Baseline 3-    Status On-going      PT LONG TERM GOAL #4   Title Pt will be able to achieve 71% predicted score on FOTO to signify meaningful improvement in functional abilites.    Baseline 58%    Status On-going                   Plan - 01/11/21 1535     Clinical Impression Statement Pt was able to complete all prescribed exercises with no adverse effect. Therapy today continued to focus on improving R ankle strength/mobility/stability as well as general LE strength and plyometric movements. She continues to progress very well with therapy, tolerating plyometric jumps and progression of activity. Pt should be a good candidate for d/c at next session barring meeting all LTGs. Will assess  goals and create final HEP as tolerated.    PT Treatment/Interventions ADLs/Self Care Home Management;Aquatic Therapy;Cryotherapy;Electrical Stimulation;Moist Heat;Gait training;Functional mobility training;Therapeutic activities;Therapeutic exercise;Balance training;Neuromuscular re-education;Patient/family education;Passive range of motion;Dry needling;Manual techniques;Taping;Vasopneumatic Device    PT Next Visit Plan continue ankle/foot strengthening and balance    PT Home Exercise Plan [id number]             Patient will benefit from skilled therapeutic intervention in order to improve the following deficits and impairments:  Abnormal gait, Decreased range of motion, Difficulty walking, Pain, Decreased activity tolerance, Decreased strength, Increased edema  Visit Diagnosis: Pain in right ankle and joints of right foot  Muscle weakness (generalized)  Stiffness of right ankle, not elsewhere classified  Right foot pain     Problem List Patient Active Problem List   Diagnosis Date Noted   Postmenopausal bleeding 09/07/2020    Ward Chatters, PT 01/11/2021, 4:13 PM  [facility] [address] [place], [place], [address] Phone: [phone]   Fax:  [phone]  Name: Jimi Giza MRN: [id number] Date of Birth: [birth year]

## 2021-01-13 ENCOUNTER — Ambulatory Visit

## 2021-01-13 ENCOUNTER — Other Ambulatory Visit: Payer: Self-pay

## 2021-01-13 DIAGNOSIS — M79671 Pain in right foot: Secondary | ICD-10-CM

## 2021-01-13 DIAGNOSIS — M25671 Stiffness of right ankle, not elsewhere classified: Secondary | ICD-10-CM

## 2021-01-13 DIAGNOSIS — M25571 Pain in right ankle and joints of right foot: Secondary | ICD-10-CM | POA: Diagnosis not present

## 2021-01-13 DIAGNOSIS — M6281 Muscle weakness (generalized): Secondary | ICD-10-CM

## 2021-01-13 NOTE — Therapy (Signed)
[place], [place], [address] Phone: [phone]   Fax:  [phone]  Physical Therapy Treatment/Discharge  Patient Details  Name: Naveya Ellerman MRN: [id number] Date of Birth: 1960/12/24 Referring Provider (PT): Celesta Gentile   Encounter Date: 01/13/2021   PT End of Session - 01/13/21 1533     Visit Number 12    Number of Visits 13    Date for PT Re-Evaluation 01/15/21    Authorization Type [employer]    PT Start Time 1533    PT Stop Time 1601    PT Time Calculation (min) 28 min    Activity Tolerance Patient tolerated treatment well    Behavior During Therapy [facility] for tasks assessed/performed             History reviewed. No pertinent past medical history.  History reviewed. No pertinent surgical history.  There were no vitals filed for this visit.   Subjective Assessment - 01/13/21 1534     Subjective Patient reports her foot is feeling much better. "I feel great." She reports an occasional ache in the foot, but otherwise no complaints. She feels that she is ready for D/C. She has f/u with Dr. Jacqualyn Posey at the end of the month.    How long can you sit comfortably? unlimited    How long can you stand comfortably? unlimited    How long can you walk comfortably? 45 minutes- 1 hour    Currently in Pain? No/denies                [facility] PT Assessment - 01/13/21 0001       Observation/Other Assessments   Focus on Therapeutic Outcomes (FOTO)  99%      AROM   Right Ankle Dorsiflexion 10    Right Ankle Plantar Flexion 65    Right Ankle Inversion 20    Right Ankle Eversion 18      Strength   Right Knee Flexion 5/5    Right Knee Extension 5/5    Right Ankle Dorsiflexion 5/5    Right Ankle Plantar Flexion 5/5    Right Ankle Inversion 5/5    Right Ankle Eversion 5/5      Palpation   Palpation comment no palpable tenderness      Ambulation/Gait   Ambulation/Gait Yes     Ambulation/Gait Assistance 7: Independent    Gait Comments WNL               [facility] Adult PT Treatment:                                                DATE: 01/13/21 Therapeutic Exercise: Reviewed and updated HEP Discussed appropriate progression when completing resistance training at the gym.   Therapeutic Activity: Education on re-assessment findings and progress towards goals. D/C education.                        PT Short Term Goals - 01/13/21 1548       PT SHORT TERM GOAL #1   Title Pt will be able to tolerate standing WB through Rt ankle and foot without CAM boot with minimal reports of pain to improve tolerance to ADLs.    Baseline unable    Status Achieved    Target Date 11/25/20  PT SHORT TERM GOAL #2   Title Pt will be able to achieve 5 degrees of AROM dorsiflexion while seated to improve weight bearing function of the ankle in standing and walking.    Baseline see flowsheet    Status Achieved    Target Date 11/25/20      PT SHORT TERM GOAL #3   Title Pt will be able to perform a sit to stand transfer modified indep without the CAM boot in order to optimize independence with ADLs.    Baseline unable    Status Achieved    Target Date 12/16/20               PT Long Term Goals - 01/13/21 1548       PT LONG TERM GOAL #1   Title Pt will be able to walk 50 feet without CAM boot with minimal reports of pain and no increase in swelling to optimize walking capacity.    Baseline unable    Status Achieved      PT LONG TERM GOAL #2   Title Pt will be able to achieve 5/5 strength in Rt quadriceps and hamstrings to optimize function with walking, transfers, and ADLs.    Baseline 4-    Status Achieved      PT LONG TERM GOAL #3   Title Pt will be able to achieve 5/5 strength in Rt dorsiflexors without pain to improve stability of the ankle joint and optimize standing function.    Baseline 3-    Status Achieved      PT LONG TERM GOAL #4    Title Pt will be able to achieve 71% predicted score on FOTO to signify meaningful improvement in functional abilites.    Baseline 58%    Status Achieved                   Plan - 01/13/21 1603     Clinical Impression Statement Toshika has made excellent functional progress since the start of care having met all established functional goals. She demonstrates full and pain free Rt ankle ROM and strength, normalized gait mechanics, and overall improved activity tolerance. She is therefore appropriate for discharge at this time.             Patient will benefit from skilled therapeutic intervention in order to improve the following deficits and impairments:     Visit Diagnosis: Pain in right ankle and joints of right foot  Muscle weakness (generalized)  Stiffness of right ankle, not elsewhere classified  Right foot pain     Problem List Patient Active Problem List   Diagnosis Date Noted   Postmenopausal bleeding 09/07/2020   PHYSICAL THERAPY DISCHARGE SUMMARY  Visits from Start of Care: 12  Current functional level related to goals / functional outcomes: All goals met   Remaining deficits: N/A   Education / Equipment: See treatment above   Patient agrees to discharge. Patient goals were met. Patient is being discharged due to meeting the stated rehab goals. Gwendolyn Grant, PT, DPT, ATC 01/13/21 4:10 PM  [facility] [address] [place], [place], [address] Phone: [phone]   Fax:  [phone]  Name: Laray Rivkin MRN: [id number] Date of Birth: [birth year]

## 2021-02-03 ENCOUNTER — Ambulatory Visit: Admitting: Podiatry

## 2021-06-23 ENCOUNTER — Ambulatory Visit (INDEPENDENT_AMBULATORY_CARE_PROVIDER_SITE_OTHER)

## 2021-06-23 ENCOUNTER — Ambulatory Visit (INDEPENDENT_AMBULATORY_CARE_PROVIDER_SITE_OTHER): Admitting: Podiatry

## 2021-06-23 DIAGNOSIS — M778 Other enthesopathies, not elsewhere classified: Secondary | ICD-10-CM

## 2021-06-23 DIAGNOSIS — R208 Other disturbances of skin sensation: Secondary | ICD-10-CM

## 2021-06-23 DIAGNOSIS — M792 Neuralgia and neuritis, unspecified: Secondary | ICD-10-CM

## 2021-06-26 NOTE — Progress Notes (Signed)
Subjective: 61 year old female presents the office today with concerns of red discoloration to the bottoms of both of her feet as well as burning sensations to the bottoms of both of her feet.  This happens mostly after walking or exercising.  She does get other times as well even with sandals but is not consistent.  No recent treatment for this.  She states that on the right foot from the injury has been doing well.  Objective: AAO x3, NAD DP/PT pulses palpable bilaterally, CRT less than 3 seconds No significant discoloration present but she did show me a picture with the bottom of the feet were quite red.  She describes a burning sensation to the bottom of her feet when this happens but no specific tenderness noted today.  There is negative Tinel's sign.  MMT 5/5 there is no weakness.  Flexor, extensor tendons appear to be intact.  No pain with calf compression, swelling, warmth, erythema  Assessment: Burning sensation bilateral feet with discoloration  Plan: -All treatment options discussed with the patient including all alternatives, risks, complications.  -X-rays obtained reviewed of bilateral feet  No evidence of acute fracture in the area is healed -After injury she had some nerve issues as well as some concern for CRPS.  It could be result of this and also it seems is more related to shoe gear and she also describes that her shoes are too tight.  I want her to try to stay active and restart gabapentin which she already has at home.  We are going to work on changing shoes and socks as well. -Also if no improvement nerve work-up and/or blood work. -Patient encouraged to call the office with any questions, concerns, change in symptoms.   Trula Slade DPM

## 2021-08-11 ENCOUNTER — Ambulatory Visit: Admitting: Podiatry

## 2022-10-20 ENCOUNTER — Encounter: Payer: Self-pay | Admitting: Neurology

## 2022-10-20 ENCOUNTER — Ambulatory Visit (INDEPENDENT_AMBULATORY_CARE_PROVIDER_SITE_OTHER): Admitting: Neurology

## 2022-10-20 VITALS — BP 110/63 | HR 70 | Ht 60.0 in | Wt 108.0 lb

## 2022-10-20 DIAGNOSIS — R202 Paresthesia of skin: Secondary | ICD-10-CM

## 2022-10-20 NOTE — Progress Notes (Signed)
Chief Complaint  Patient presents with   Numbness    RM 15 alone  Pt is well, reports she has been having burning bilateral feet for a year and half. She currently have symptoms at night only.       ASSESSMENT AND PLAN  Briana Reid is a 62 y.o. female   Bilateral feet paresthesia  Differentiation diagnosis including peripheral neuropathy  A1c was 5.7, complete evaluation with more extensive laboratory evaluation to rule out treatable etiology  EMG nerve conduction study  DIAGNOSTIC DATA (LABS, IMAGING, TESTING) - I reviewed patient records, labs, notes, testing and imaging myself where available.   MEDICAL HISTORY:  Briana Reid, is a 62 year old female seen in request by her primary care from [facility] Dr. Vivi Ferns, Jovita Gamma, for evaluation of bilateral feet paresthesia, initial evaluation October 20, 2022  History is obtained from the patient and review of electronic medical records. I personally reviewed pertinent available imaging films in PACS.   PMHx of  HTN  Since 2023 she began to notice bilateral sole discomfort, hot, burning sensation, showed me the picture, erythematous dry skin, her paresthesia comes and goes, most obvious after weightbearing, hanging her feet in a dependent position, or at nighttime, she denies difficulty sleeping denies gait abnormality,  She has chronic low back pain, denies bowel and bladder incontinence  Lab in July 2024, A1c 5.7, normal CBC B12, vitamin D, CMP, LDL 71     PHYSICAL EXAM:   Vitals:   10/20/22 0952  BP: 110/63  Pulse: 70  Weight: 108 lb (49 kg)  Height: 5' (1.524 m)   Not recorded     Body mass index is 21.09 kg/m.  PHYSICAL EXAMNIATION:  Gen: NAD, conversant, well nourised, well groomed                     Cardiovascular: Regular rate rhythm, no peripheral edema, warm, nontender. Eyes: Conjunctivae clear without exudates or hemorrhage Neck: Supple, no carotid bruits. Pulmonary: Clear to  auscultation bilaterally   NEUROLOGICAL EXAM:  MENTAL STATUS: Speech/cognition: Awake, alert, oriented to history taking and casual conversation CRANIAL NERVES: CN II: Visual fields are full to confrontation. Pupils are round equal and briskly reactive to light. CN III, IV, VI: extraocular movement are normal. No ptosis. CN V: Facial sensation is intact to light touch CN VII: Face is symmetric with normal eye closure  CN VIII: Hearing is normal to causal conversation. CN IX, X: Phonation is normal. CN XI: Head turning and shoulder shrug are intact  MOTOR: There is no pronator drift of out-stretched arms. Muscle bulk and tone are normal. Muscle strength is normal.  REFLEXES: Reflexes are 2+ and symmetric at the biceps, triceps, knees, and ankles. Plantar responses are flexor.  SENSORY: Intact to light touch, pinprick and vibratory sensation are intact in fingers and toes.  COORDINATION: There is no trunk or limb dysmetria noted.  GAIT/STANCE: Posture is normal. Gait is steady with normal steps, base, arm swing, and turning. Heel and toe walking are normal. Tandem gait is normal.  Romberg is absent.  REVIEW OF SYSTEMS:  Full 14 system review of systems performed and notable only for as above All other review of systems were negative.   ALLERGIES: No Known Allergies  HOME MEDICATIONS: Current Outpatient Medications  Medication Sig Dispense Refill   Ascorbic Acid (LIQUID C 500) 500 MG/15ML LIQD See admin instructions.     aspirin EC 81 MG tablet Take 81 mg by mouth daily. Swallow  whole.     Cholecalciferol 25 MCG (1000 UT) capsule Vitamin D3 25 mcg (1,000 unit) capsule   2 units every day by oral route.     Magnesium Bisglycinate (MAG GLYCINATE) 100 MG TABS magnesium glycinate 100 mg tablet     MANGANESE SULFATE IV 2 units every day by oral route.     Menaquinone-7 (VITAMIN K2) 100 MCG CAPS Take by mouth.     omega-3 fish oil (MAXEPA) 1000 MG CAPS capsule Omega-3 Fish  Oil 1,000 mg (120 mg-180 mg) capsule     rosuvastatin (CRESTOR) 10 MG tablet Take by mouth.     No current facility-administered medications for this visit.    PAST MEDICAL HISTORY: History reviewed. No pertinent past medical history.  PAST SURGICAL HISTORY: History reviewed. No pertinent surgical history.  FAMILY HISTORY: History reviewed. No pertinent family history.  SOCIAL HISTORY: Social History   Socioeconomic History   Marital status: Married    Spouse name: Not on file   Number of children: Not on file   Years of education: Not on file   Highest education level: Not on file  Occupational History   Not on file  Tobacco Use   Smoking status: Never   Smokeless tobacco: Never  Substance and Sexual Activity   Alcohol use: Not on file   Drug use: Not on file   Sexual activity: Not on file  Other Topics Concern   Not on file  Social History Narrative   Not on file   Social Determinants of Health   Financial Resource Strain: Low Risk  (07/11/2021)   Received from [facility] visits prior to 03/11/2022., [facility]   Overall Financial Resource Strain (CARDIA)    Difficulty of Paying Living Expenses: Not hard at all  Food Insecurity: Low Risk  (08/11/2022)   Received from [facility]   Hunger Vital Sign    Worried About Running Out of Food in the Last Year: Never true    Ran Out of Food in the Last Year: Never true  Transportation Needs: Not on file (08/11/2022)  Physical Activity: Sufficiently Active (07/11/2021)   Received from [facility] visits prior to 03/11/2022., [facility]   Exercise Vital Sign    Days of Exercise per Week: 4 days    Minutes of Exercise per Session: 60 min  Stress: No Stress Concern Present (07/11/2021)   Received from [facility] visits prior to 03/11/2022., [facility]   [employer] of Occupational Health - Occupational Stress Questionnaire    Feeling of Stress : Not at  all  Social Connections: Unknown (07/11/2021)   Received from [facility] visits prior to 03/11/2022., [facility]   Social Connection and Isolation Panel [NHANES]    Frequency of Communication with Friends and Family: Patient declined    Frequency of Social Gatherings with Friends and Family: Patient declined    Attends Religious Services: Patient declined    Database administrator or Organizations: No    Attends Banker Meetings: Never    Marital Status: Married  Catering manager Violence: Unknown (04/15/2021)   Received from [employer]   HITS    Physically Hurt: Not on file    Insult or Talk Down To: Not on file    Threaten Physical Harm: Not on file    Scream or Curse: Not on file      Levert Feinstein, M.D. Ph.D.  [facility] [address], [address] [place], [place] [address] Ph: [phone] Fax: [phone]  CC:  Mattie Marlin, DO [address],  [place] [address]  Lazoff, Shawn P, DO

## 2022-10-26 LAB — MULTIPLE MYELOMA PANEL, SERUM
Albumin SerPl Elph-Mcnc: 4.1 g/dL (ref 2.9–4.4)
Albumin/Glob SerPl: 1.7 (ref 0.7–1.7)
Alpha 1: 0.2 g/dL (ref 0.0–0.4)
Alpha2 Glob SerPl Elph-Mcnc: 0.6 g/dL (ref 0.4–1.0)
B-Globulin SerPl Elph-Mcnc: 0.9 g/dL (ref 0.7–1.3)
Gamma Glob SerPl Elph-Mcnc: 0.8 g/dL (ref 0.4–1.8)
Globulin, Total: 2.5 g/dL (ref 2.2–3.9)
IgA/Immunoglobulin A, Serum: 315 mg/dL (ref 87–352)
IgG (Immunoglobin G), Serum: 915 mg/dL (ref 586–1602)
IgM (Immunoglobulin M), Srm: 16 mg/dL — ABNORMAL LOW (ref 26–217)
Total Protein: 6.6 g/dL (ref 6.0–8.5)

## 2022-10-26 LAB — C-REACTIVE PROTEIN: CRP: <1 mg/L (ref 0–10)

## 2022-10-26 LAB — ANA W/REFLEX IF POSITIVE: Anti Nuclear Antibody (ANA): NEGATIVE

## 2022-10-26 LAB — SEDIMENTATION RATE: Sed Rate: 2 mm/h (ref 0–40)

## 2022-12-26 ENCOUNTER — Ambulatory Visit (INDEPENDENT_AMBULATORY_CARE_PROVIDER_SITE_OTHER): Admitting: Neurology

## 2022-12-26 ENCOUNTER — Ambulatory Visit: Admitting: Neurology

## 2022-12-26 VITALS — Ht 60.0 in | Wt 108.0 lb

## 2022-12-26 DIAGNOSIS — R202 Paresthesia of skin: Secondary | ICD-10-CM

## 2022-12-26 DIAGNOSIS — Z0289 Encounter for other administrative examinations: Secondary | ICD-10-CM

## 2022-12-26 DIAGNOSIS — R899 Unspecified abnormal finding in specimens from other organs, systems and tissues: Secondary | ICD-10-CM | POA: Insufficient documentation

## 2022-12-26 NOTE — Progress Notes (Signed)
EMG report is under procedure tab. 

## 2022-12-26 NOTE — Procedures (Signed)
Full Name: Briana Reid Gender: Female MRN #: [id number] Date of Birth: May 15, 1960    Visit Date: 12/26/2022 10:46 Age: 62 Years Examining Physician: Dr. Levert Feinstein Referring Physician: Dr. Levert Feinstein Height: 5 feet 0 inch History: 62 year old female complains of bilateral plantar feet burning discomfort after bearing weight  Summary of the test:  Nerve conduction study: Lateral sural, superficial peroneal sensory responses were normal.  Bilateral tibial motor responses were robust.  Bilateral peroneal to EDB motor responses showed slightly decreased CMAP amplitude, with normal distal latency, conduction velocity.  Electromyography:  Selected needle examination of bilateral lower extremity muscles and bilateral lumbosacral paraspinal muscles were normal.  Conclusion: This is a essentially normal study.  There is no electrodiagnostic evidence of large fiber peripheral neuropathy or bilateral lumbosacral radiculopathy.  The mildly decreased CMAP amplitude at bilateral peroneal to EDB motor responses could due to underdevelopment of extensor digitorum brevis muscles on this patient.    Levert Feinstein. M.D. Ph.D.   [facility] [address] [place], [place] [address] Tel: [phone] Fax: [phone]  Verbal informed consent was obtained from the patient, patient was informed of potential risk of procedure, including bruising, bleeding, hematoma formation, infection, muscle weakness, muscle pain, numbness, among others.        MNC    Nerve / Sites Muscle Latency Ref. Amplitude Ref. Rel Amp Segments Distance Velocity Ref. Area    ms ms mV mV %  cm m/s m/s mVms  L Peroneal - EDB     Ankle EDB 4.2 <=6.5 1.8 >=2.0 100 Ankle - EDB 9   7.4     Fib head EDB 9.4  1.5  79.9 Fib head - Ankle 24 47 >=44 5.8     Pop fossa EDB 10.9  2.1  141 Pop fossa - Fib head 10 65 >=44 9.0         Pop fossa - Ankle      R Peroneal - EDB     Ankle EDB 4.5 <=6.5 1.4  >=2.0 100 Ankle - EDB 9   6.1     Fib head EDB 9.9  1.7  118 Fib head - Ankle 26 48 >=44 7.8     Pop fossa EDB 11.4  1.5  88.9 Pop fossa - Fib head 9 63 >=44 7.1         Pop fossa - Ankle      L Tibial - AH     Ankle AH 3.6 <=5.8 15.8 >=4.0 100 Ankle - AH 9   42.9     Pop fossa AH 9.9  11.0  69.5 Pop fossa - Ankle 34 54 >=41 34.7  R Tibial - AH     Ankle AH 4.3 <=5.8 14.8 >=4.0 100 Ankle - AH 9   43.8     Pop fossa AH 10.7  11.0  74.7 Pop fossa - Ankle 31 48 >=41 41.3             SNC    Nerve / Sites Rec. Site Peak Lat Ref.  Amp Ref. Segments Distance    ms ms V V  cm  L Sural - Ankle (Calf)     Calf Ankle 3.4 <=4.4 13 >=6 Calf - Ankle 14  R Sural - Ankle (Calf)     Calf Ankle 3.5 <=4.4 7 >=6 Calf - Ankle 14  L Superficial peroneal - Ankle     Lat leg Ankle 3.6 <=4.4 13 >=6  Lat leg - Ankle 14  R Superficial peroneal - Ankle     Lat leg Ankle 3.4 <=4.4 14 >=6 Lat leg - Ankle 14             F  Wave    Nerve F Lat Ref.   ms ms  L Tibial - AH 41.9 <=56.0  R Tibial - AH 43.2 <=56.0         H Reflex    Nerve H Lat Lat Hmax   ms ms   Left Right Ref. Left Right Ref.  Tibial - Soleus 34.4 35.3 <=35.0 27.8 28.3 <=35.0         EMG Summary Table    Spontaneous MUAP Recruitment  Muscle IA Fib PSW Fasc Other Amp Dur. Poly Pattern  L. Tibialis anterior Normal None None None _______ Normal Normal Normal Normal  L. Tibialis posterior Normal None None None _______ Normal Normal Normal Normal  L. Peroneus longus Normal None None None _______ Normal Normal Normal Normal  L. Gastrocnemius (Medial head) Normal None None None _______ Normal Normal Normal Normal  L. Vastus lateralis Normal None None None _______ Normal Normal Normal Normal  R. Tibialis anterior Normal None None None _______ Normal Normal Normal Normal  R. Tibialis posterior Normal None None None _______ Normal Normal Normal Normal  R. Peroneus longus Normal None None None _______ Normal Normal Normal Normal  R. Vastus  lateralis Normal None None None _______ Normal Normal Normal Normal  R. Gastrocnemius (Medial head) Normal None None None _______ Normal Normal Normal Normal  L. Lumbar paraspinals (low) Normal None None None _______ Normal Normal Normal Normal  L. Lumbar paraspinals (mid) Normal None None None _______ Normal Normal Normal Normal  R. Lumbar paraspinals (mid) Normal None None None _______ Normal Normal Normal Normal  R. Lumbar paraspinals (low) Normal None None None _______ Normal Normal Normal Normal

## 2022-12-26 NOTE — Progress Notes (Signed)
Replete IgM laboratory evaluation, it was low at 16 in October 2024

## 2022-12-27 LAB — IGG, IGA, IGM
IgA/Immunoglobulin A, Serum: 306 mg/dL (ref 87–352)
IgG (Immunoglobin G), Serum: 922 mg/dL (ref 586–1602)
IgM (Immunoglobulin M), Srm: 16 mg/dL — ABNORMAL LOW (ref 26–217)

## 2022-12-29 ENCOUNTER — Encounter: Admitting: Neurology

## 2022-12-29 ENCOUNTER — Encounter

## 2023-03-12 ENCOUNTER — Other Ambulatory Visit: Payer: Self-pay | Admitting: Urology

## 2023-03-12 DIAGNOSIS — N281 Cyst of kidney, acquired: Secondary | ICD-10-CM

## 2023-04-03 ENCOUNTER — Ambulatory Visit
Admission: RE | Admit: 2023-04-03 | Discharge: 2023-04-03 | Disposition: A | Discharge status: Home / Self Care | Payer: Self-pay | Source: Ambulatory Visit | Attending: Urology | Admitting: Urology

## 2023-04-03 DIAGNOSIS — N281 Cyst of kidney, acquired: Secondary | ICD-10-CM

## 2023-04-03 MED ORDER — GADOPICLENOL 0.5 MMOL/ML IV SOLN
5.0000 mL | Freq: Once | INTRAVENOUS | Status: AC | PRN
  Administered 2023-04-03: 5 mL via INTRAVENOUS
